# Patient Record
Sex: Female | Born: 1965 | Race: Black or African American | Hispanic: No | Marital: Married
Health system: Southern US, Community
[De-identification: ages and names within clinical notes are randomized; demographics above are authoritative.]

## PROBLEM LIST (undated history)

## (undated) HISTORY — PX: BREAST EXCISIONAL BIOPSY: SUR124

---

## 2007-01-08 ENCOUNTER — Other Ambulatory Visit: Admission: RE | Admit: 2007-01-08 | Discharge: 2007-01-08 | Payer: Self-pay | Admitting: Family Medicine

## 2007-03-20 ENCOUNTER — Encounter: Admission: RE | Admit: 2007-03-20 | Discharge: 2007-03-20 | Payer: Self-pay | Admitting: Family Medicine

## 2008-04-09 ENCOUNTER — Encounter: Admission: RE | Admit: 2008-04-09 | Discharge: 2008-04-09 | Payer: Self-pay | Admitting: Family Medicine

## 2009-02-23 ENCOUNTER — Ambulatory Visit (HOSPITAL_COMMUNITY): Admission: RE | Admit: 2009-02-23 | Discharge: 2009-02-23 | Payer: Self-pay | Admitting: Obstetrics and Gynecology

## 2009-02-23 ENCOUNTER — Encounter (INDEPENDENT_AMBULATORY_CARE_PROVIDER_SITE_OTHER): Payer: Self-pay | Admitting: Obstetrics and Gynecology

## 2011-03-15 LAB — RH IMMUNE GLOBULIN WORKUP (NOT WOMEN'S HOSP): Antibody Screen: NEGATIVE

## 2011-03-15 LAB — CBC
HCT: 42.5 % (ref 36.0–46.0)
Hemoglobin: 14.3 g/dL (ref 12.0–15.0)
RBC: 4.29 MIL/uL (ref 3.87–5.11)
WBC: 7.8 10*3/uL (ref 4.0–10.5)

## 2011-04-17 NOTE — H&P (Signed)
NAME:  ARELLA, BLINDER NO.:  [id number]   MEDICAL RECORD NO.:  [id number]          PATIENT TYPE:  AMB   LOCATION:  SDC                           FACILITY:  [facility]   PHYSICIAN:  Huel Cote, M.D. DATE OF BIRTH:  [birth year]   DATE OF ADMISSION:  DATE OF DISCHARGE:                              HISTORY & PHYSICAL   The patient is a 45 year old, G1, P0, who is coming in with an  unfortunate finding of a fetal demise at 9-1/2 weeks' gestation.  The  patient was found to have this on her routine OB workup.  This has been  confirmed by ultrasound.  She was given several options and desired to  proceed with a D and E given the high likelihood of heavy bleeding when  she did ultimately miscarry.  Other than her advanced maternal age, she  has had no other issues with the pregnancy except for an Rh negative  status and she will require RhoGAM.   PAST MEDICAL HISTORY:  Significant for thyroid disease, which has been  monitored by Dr. Talmage Nap.  On her prenatal workup, her TSH was slightly  elevated.  Her medications have been adjusted accordingly.  Her other  medical history is significant only for allergies.  She does have some  depression and is on Zoloft 100 mg p.o. daily for that.   PAST SURGICAL HISTORY:  She had a benign lumpectomy on her left breast  and a wisdom tooth extraction.  She is usually a smoker, however, did  quit with pregnancy.   PAST GYN HISTORY:  Abnormal Pap smear 2 years ago with a last colposcopy  within normal limits and the last Pap smear within normal limits.  She  does have some fibroids noted on ultrasound.   PHYSICAL EXAMINATION:  VITAL SIGNS:  The patient is 164 pounds and blood  pressure is 100/60.  CARDIAC:  Regular rate and rhythm.  LUNGS:  Clear.  ABDOMEN:  Soft and nontender.  PELVIC:  Uterus consistent with a 9-week gestation and no other external  problems noted.   Again, the patient was counseled as to the risks and benefits of  surgery  including bleeding and uterine perforation.  She understands these  risks.  However, desires to proceed with the surgery as stated.  We will  proceed with a D and E with Cytotec treatment 3-4 hours prior surgery to  minimize the risk of uterine perforation and the patient is agreeable  with this process.      Huel Cote, M.D.  Electronically Signed     KR/MEDQ  D:  02/17/2009  T:  02/18/2009  Job:  [id number]

## 2011-04-17 NOTE — Op Note (Signed)
NAME:  April Elliott, April Elliott NO.:  [id number]   MEDICAL RECORD NO.:  [id number]          PATIENT TYPE:  AMB   LOCATION:  SDC                           [facility]ILITY:  [facility]   PHYSICIAN:  Huel Cote, M.D. DATE OF BIRTH:  November 03, 1966   DATE OF PROCEDURE:  02/23/2009  DATE OF DISCHARGE:                               OPERATIVE REPORT   PREOPERATIVE DIAGNOSIS:  Missed abortion at 9+ weeks.   POSTOPERATIVE DIAGNOSIS:  Missed abortion at 9+ weeks.   PROCEDURE:  Suction, dilation and evacuation.   SURGEON:  Huel Cote, M.D.   ASSISTANT:  None.   ANESTHESIA:  MAC with 1% paracervical block.   SPECIMENS:  Moderate amount of POCs were sent to Pathology and to  Genetics.   ESTIMATED BLOOD LOSS:  50 mL.   IV FLUIDS:  1 L.   COMPLICATIONS:  None known.   DESCRIPTION OF PROCEDURE:  The patient was taken to operating room where  MAC anesthesia was obtained without difficulty.  She was then prepped  and draped in normal sterile fashion in the dorsal lithotomy position.  A speculum was placed within the vagina and the cervix was identified  and injected on the anterior lip with 2 mL of 1% lidocaine plain.  A  tenaculum was then placed and additional paracervical block was  performed at 2 and 10 o'clock with approximately 10 mL each of each  placed at each location.  The uterus was then easily sounded to  approximately 9-10 cm and a size to 8 suction curette chosen.  Minimal  amount of dilation was needed and the suction curette was easily  introduced into the fundus.  Suction was applied and moderate-to-large  amount of products of conception were obtained in several passes.  The  suction was discontinued and a sharp curettage performed with no  additional tissue palpated.  Two additional passes of the suction were  performed with really no tissue obtained.  Therefore the procedure was  concluded and the tenaculum removed.  Small areas of bleeding at that  site were  treated with silver nitrate.  There is absolutely no active  bleeding noted at the end of procedure and the patient was taken to the  recovery room in stable condition.  She will be discharged home from  there and received RhoGAM due to her Rh negative status, prior to  discharge.      Huel Cote, M.D.  Electronically Signed     KR/MEDQ  D:  02/23/2009  T:  02/24/2009  Job:  [id number]

## 2011-05-25 ENCOUNTER — Other Ambulatory Visit: Payer: Self-pay | Admitting: Family Medicine

## 2011-05-25 DIAGNOSIS — Z1231 Encounter for screening mammogram for malignant neoplasm of breast: Secondary | ICD-10-CM

## 2011-06-07 ENCOUNTER — Ambulatory Visit
Admission: RE | Admit: 2011-06-07 | Discharge: 2011-06-07 | Disposition: A | Discharge status: Home / Self Care | Payer: BC Managed Care – PPO | Source: Ambulatory Visit | Attending: Family Medicine | Admitting: Family Medicine

## 2011-06-07 DIAGNOSIS — Z1231 Encounter for screening mammogram for malignant neoplasm of breast: Secondary | ICD-10-CM

## 2011-07-18 ENCOUNTER — Other Ambulatory Visit: Payer: Self-pay | Admitting: Family Medicine

## 2011-07-18 DIAGNOSIS — N644 Mastodynia: Secondary | ICD-10-CM

## 2011-08-01 ENCOUNTER — Ambulatory Visit
Admission: RE | Admit: 2011-08-01 | Discharge: 2011-08-01 | Disposition: A | Discharge status: Home / Self Care | Payer: BC Managed Care – PPO | Source: Ambulatory Visit | Attending: Family Medicine | Admitting: Family Medicine

## 2011-08-01 ENCOUNTER — Other Ambulatory Visit: Payer: Self-pay | Admitting: Family Medicine

## 2011-08-01 DIAGNOSIS — N644 Mastodynia: Secondary | ICD-10-CM

## 2011-11-15 ENCOUNTER — Ambulatory Visit: Payer: BC Managed Care – PPO | Admitting: Physical Therapy

## 2016-03-05 DIAGNOSIS — N390 Urinary tract infection, site not specified: Secondary | ICD-10-CM | POA: Diagnosis not present

## 2016-03-05 DIAGNOSIS — M545 Low back pain: Secondary | ICD-10-CM | POA: Diagnosis not present

## 2016-03-06 DIAGNOSIS — R399 Unspecified symptoms and signs involving the genitourinary system: Secondary | ICD-10-CM | POA: Diagnosis not present

## 2016-03-06 DIAGNOSIS — R3915 Urgency of urination: Secondary | ICD-10-CM | POA: Diagnosis not present

## 2016-03-06 DIAGNOSIS — R109 Unspecified abdominal pain: Secondary | ICD-10-CM | POA: Diagnosis not present

## 2016-03-07 DIAGNOSIS — Q446 Cystic disease of liver: Secondary | ICD-10-CM | POA: Diagnosis not present

## 2016-03-07 DIAGNOSIS — K449 Diaphragmatic hernia without obstruction or gangrene: Secondary | ICD-10-CM | POA: Diagnosis not present

## 2016-03-08 DIAGNOSIS — M5412 Radiculopathy, cervical region: Secondary | ICD-10-CM | POA: Diagnosis not present

## 2016-03-15 DIAGNOSIS — R3915 Urgency of urination: Secondary | ICD-10-CM | POA: Diagnosis not present

## 2016-03-15 DIAGNOSIS — D3501 Benign neoplasm of right adrenal gland: Secondary | ICD-10-CM | POA: Diagnosis not present

## 2016-03-15 DIAGNOSIS — R109 Unspecified abdominal pain: Secondary | ICD-10-CM | POA: Diagnosis not present

## 2016-03-15 DIAGNOSIS — R399 Unspecified symptoms and signs involving the genitourinary system: Secondary | ICD-10-CM | POA: Diagnosis not present

## 2016-03-20 DIAGNOSIS — M5416 Radiculopathy, lumbar region: Secondary | ICD-10-CM | POA: Diagnosis not present

## 2016-03-20 DIAGNOSIS — M5412 Radiculopathy, cervical region: Secondary | ICD-10-CM | POA: Diagnosis not present

## 2016-03-22 DIAGNOSIS — L80 Vitiligo: Secondary | ICD-10-CM | POA: Diagnosis not present

## 2016-03-23 DIAGNOSIS — M5186 Other intervertebral disc disorders, lumbar region: Secondary | ICD-10-CM | POA: Diagnosis not present

## 2016-03-23 DIAGNOSIS — M4806 Spinal stenosis, lumbar region: Secondary | ICD-10-CM | POA: Diagnosis not present

## 2016-03-27 DIAGNOSIS — L80 Vitiligo: Secondary | ICD-10-CM | POA: Diagnosis not present

## 2016-03-29 DIAGNOSIS — L680 Hirsutism: Secondary | ICD-10-CM | POA: Diagnosis not present

## 2016-03-29 DIAGNOSIS — M545 Low back pain: Secondary | ICD-10-CM | POA: Diagnosis not present

## 2016-03-29 DIAGNOSIS — I781 Nevus, non-neoplastic: Secondary | ICD-10-CM | POA: Diagnosis not present

## 2016-03-29 DIAGNOSIS — L80 Vitiligo: Secondary | ICD-10-CM | POA: Diagnosis not present

## 2016-03-29 DIAGNOSIS — M5412 Radiculopathy, cervical region: Secondary | ICD-10-CM | POA: Diagnosis not present

## 2016-03-29 DIAGNOSIS — M5416 Radiculopathy, lumbar region: Secondary | ICD-10-CM | POA: Diagnosis not present

## 2016-04-02 DIAGNOSIS — R399 Unspecified symptoms and signs involving the genitourinary system: Secondary | ICD-10-CM | POA: Diagnosis not present

## 2016-04-02 DIAGNOSIS — L80 Vitiligo: Secondary | ICD-10-CM | POA: Diagnosis not present

## 2016-04-02 DIAGNOSIS — R3915 Urgency of urination: Secondary | ICD-10-CM | POA: Diagnosis not present

## 2016-04-02 DIAGNOSIS — R109 Unspecified abdominal pain: Secondary | ICD-10-CM | POA: Diagnosis not present

## 2016-04-03 DIAGNOSIS — M545 Low back pain: Secondary | ICD-10-CM | POA: Diagnosis not present

## 2016-04-03 DIAGNOSIS — B373 Candidiasis of vulva and vagina: Secondary | ICD-10-CM | POA: Diagnosis not present

## 2016-04-03 DIAGNOSIS — M542 Cervicalgia: Secondary | ICD-10-CM | POA: Diagnosis not present

## 2016-04-04 DIAGNOSIS — L80 Vitiligo: Secondary | ICD-10-CM | POA: Diagnosis not present

## 2016-04-06 DIAGNOSIS — L80 Vitiligo: Secondary | ICD-10-CM | POA: Diagnosis not present

## 2016-04-16 DIAGNOSIS — Z113 Encounter for screening for infections with a predominantly sexual mode of transmission: Secondary | ICD-10-CM | POA: Diagnosis not present

## 2016-04-16 DIAGNOSIS — Z1151 Encounter for screening for human papillomavirus (HPV): Secondary | ICD-10-CM | POA: Diagnosis not present

## 2016-04-16 DIAGNOSIS — N898 Other specified noninflammatory disorders of vagina: Secondary | ICD-10-CM | POA: Diagnosis not present

## 2016-04-16 DIAGNOSIS — Z6832 Body mass index (BMI) 32.0-32.9, adult: Secondary | ICD-10-CM | POA: Diagnosis not present

## 2016-04-16 DIAGNOSIS — Z01419 Encounter for gynecological examination (general) (routine) without abnormal findings: Secondary | ICD-10-CM | POA: Diagnosis not present

## 2016-04-16 DIAGNOSIS — R8290 Unspecified abnormal findings in urine: Secondary | ICD-10-CM | POA: Diagnosis not present

## 2016-04-16 DIAGNOSIS — E039 Hypothyroidism, unspecified: Secondary | ICD-10-CM | POA: Diagnosis not present

## 2016-04-16 DIAGNOSIS — Z79899 Other long term (current) drug therapy: Secondary | ICD-10-CM | POA: Diagnosis not present

## 2016-04-16 DIAGNOSIS — Z130 Encounter for screening for diseases of the blood and blood-forming organs and certain disorders involving the immune mechanism: Secondary | ICD-10-CM | POA: Diagnosis not present

## 2016-04-16 DIAGNOSIS — F3342 Major depressive disorder, recurrent, in full remission: Secondary | ICD-10-CM | POA: Diagnosis not present

## 2016-04-16 DIAGNOSIS — Z1231 Encounter for screening mammogram for malignant neoplasm of breast: Secondary | ICD-10-CM | POA: Diagnosis not present

## 2016-04-16 DIAGNOSIS — Z124 Encounter for screening for malignant neoplasm of cervix: Secondary | ICD-10-CM | POA: Diagnosis not present

## 2016-04-16 DIAGNOSIS — F419 Anxiety disorder, unspecified: Secondary | ICD-10-CM | POA: Diagnosis not present

## 2016-04-16 DIAGNOSIS — M542 Cervicalgia: Secondary | ICD-10-CM | POA: Diagnosis not present

## 2016-04-16 DIAGNOSIS — D259 Leiomyoma of uterus, unspecified: Secondary | ICD-10-CM | POA: Diagnosis not present

## 2016-04-16 DIAGNOSIS — Z1389 Encounter for screening for other disorder: Secondary | ICD-10-CM | POA: Diagnosis not present

## 2016-04-24 DIAGNOSIS — M542 Cervicalgia: Secondary | ICD-10-CM | POA: Diagnosis not present

## 2016-04-24 DIAGNOSIS — M545 Low back pain: Secondary | ICD-10-CM | POA: Diagnosis not present

## 2016-04-27 DIAGNOSIS — M542 Cervicalgia: Secondary | ICD-10-CM | POA: Diagnosis not present

## 2016-04-27 DIAGNOSIS — M545 Low back pain: Secondary | ICD-10-CM | POA: Diagnosis not present

## 2016-05-01 DIAGNOSIS — M545 Low back pain: Secondary | ICD-10-CM | POA: Diagnosis not present

## 2016-05-01 DIAGNOSIS — M542 Cervicalgia: Secondary | ICD-10-CM | POA: Diagnosis not present

## 2016-05-02 DIAGNOSIS — R32 Unspecified urinary incontinence: Secondary | ICD-10-CM | POA: Diagnosis not present

## 2016-05-02 DIAGNOSIS — D259 Leiomyoma of uterus, unspecified: Secondary | ICD-10-CM | POA: Diagnosis not present

## 2016-05-02 DIAGNOSIS — G8929 Other chronic pain: Secondary | ICD-10-CM | POA: Diagnosis not present

## 2016-05-02 DIAGNOSIS — N926 Irregular menstruation, unspecified: Secondary | ICD-10-CM | POA: Diagnosis not present

## 2016-05-02 DIAGNOSIS — D252 Subserosal leiomyoma of uterus: Secondary | ICD-10-CM | POA: Diagnosis not present

## 2016-05-02 DIAGNOSIS — D251 Intramural leiomyoma of uterus: Secondary | ICD-10-CM | POA: Diagnosis not present

## 2016-05-03 DIAGNOSIS — M542 Cervicalgia: Secondary | ICD-10-CM | POA: Diagnosis not present

## 2016-05-03 DIAGNOSIS — M545 Low back pain: Secondary | ICD-10-CM | POA: Diagnosis not present

## 2016-05-08 DIAGNOSIS — M542 Cervicalgia: Secondary | ICD-10-CM | POA: Diagnosis not present

## 2016-05-08 DIAGNOSIS — M545 Low back pain: Secondary | ICD-10-CM | POA: Diagnosis not present

## 2016-05-10 DIAGNOSIS — M542 Cervicalgia: Secondary | ICD-10-CM | POA: Diagnosis not present

## 2016-05-10 DIAGNOSIS — M545 Low back pain: Secondary | ICD-10-CM | POA: Diagnosis not present

## 2016-05-18 DIAGNOSIS — M5030 Other cervical disc degeneration, unspecified cervical region: Secondary | ICD-10-CM | POA: Diagnosis not present

## 2016-05-18 DIAGNOSIS — M542 Cervicalgia: Secondary | ICD-10-CM | POA: Diagnosis not present

## 2016-05-18 DIAGNOSIS — S335XXD Sprain of ligaments of lumbar spine, subsequent encounter: Secondary | ICD-10-CM | POA: Diagnosis not present

## 2016-05-18 DIAGNOSIS — M545 Low back pain: Secondary | ICD-10-CM | POA: Diagnosis not present

## 2016-05-21 DIAGNOSIS — M545 Low back pain: Secondary | ICD-10-CM | POA: Diagnosis not present

## 2016-05-21 DIAGNOSIS — M5030 Other cervical disc degeneration, unspecified cervical region: Secondary | ICD-10-CM | POA: Diagnosis not present

## 2016-05-21 DIAGNOSIS — S335XXD Sprain of ligaments of lumbar spine, subsequent encounter: Secondary | ICD-10-CM | POA: Diagnosis not present

## 2016-05-24 DIAGNOSIS — S335XXD Sprain of ligaments of lumbar spine, subsequent encounter: Secondary | ICD-10-CM | POA: Diagnosis not present

## 2016-05-24 DIAGNOSIS — H6123 Impacted cerumen, bilateral: Secondary | ICD-10-CM | POA: Diagnosis not present

## 2016-05-24 DIAGNOSIS — M542 Cervicalgia: Secondary | ICD-10-CM | POA: Diagnosis not present

## 2016-05-24 DIAGNOSIS — M545 Low back pain: Secondary | ICD-10-CM | POA: Diagnosis not present

## 2016-05-24 DIAGNOSIS — M5030 Other cervical disc degeneration, unspecified cervical region: Secondary | ICD-10-CM | POA: Diagnosis not present

## 2016-05-24 DIAGNOSIS — H9193 Unspecified hearing loss, bilateral: Secondary | ICD-10-CM | POA: Diagnosis not present

## 2016-05-29 DIAGNOSIS — S335XXD Sprain of ligaments of lumbar spine, subsequent encounter: Secondary | ICD-10-CM | POA: Diagnosis not present

## 2016-05-29 DIAGNOSIS — M545 Low back pain: Secondary | ICD-10-CM | POA: Diagnosis not present

## 2016-05-29 DIAGNOSIS — M5030 Other cervical disc degeneration, unspecified cervical region: Secondary | ICD-10-CM | POA: Diagnosis not present

## 2016-06-01 DIAGNOSIS — M542 Cervicalgia: Secondary | ICD-10-CM | POA: Diagnosis not present

## 2016-06-01 DIAGNOSIS — M545 Low back pain: Secondary | ICD-10-CM | POA: Diagnosis not present

## 2016-06-01 DIAGNOSIS — M5030 Other cervical disc degeneration, unspecified cervical region: Secondary | ICD-10-CM | POA: Diagnosis not present

## 2016-06-01 DIAGNOSIS — S335XXD Sprain of ligaments of lumbar spine, subsequent encounter: Secondary | ICD-10-CM | POA: Diagnosis not present

## 2016-06-04 DIAGNOSIS — M5030 Other cervical disc degeneration, unspecified cervical region: Secondary | ICD-10-CM | POA: Diagnosis not present

## 2016-06-04 DIAGNOSIS — S335XXD Sprain of ligaments of lumbar spine, subsequent encounter: Secondary | ICD-10-CM | POA: Diagnosis not present

## 2016-06-04 DIAGNOSIS — M545 Low back pain: Secondary | ICD-10-CM | POA: Diagnosis not present

## 2016-06-13 DIAGNOSIS — N760 Acute vaginitis: Secondary | ICD-10-CM | POA: Diagnosis not present

## 2016-06-19 DIAGNOSIS — M542 Cervicalgia: Secondary | ICD-10-CM | POA: Diagnosis not present

## 2016-06-19 DIAGNOSIS — M545 Low back pain: Secondary | ICD-10-CM | POA: Diagnosis not present

## 2016-06-19 DIAGNOSIS — M5030 Other cervical disc degeneration, unspecified cervical region: Secondary | ICD-10-CM | POA: Diagnosis not present

## 2016-06-19 DIAGNOSIS — S335XXD Sprain of ligaments of lumbar spine, subsequent encounter: Secondary | ICD-10-CM | POA: Diagnosis not present

## 2016-06-27 DIAGNOSIS — M5030 Other cervical disc degeneration, unspecified cervical region: Secondary | ICD-10-CM | POA: Diagnosis not present

## 2016-06-27 DIAGNOSIS — M542 Cervicalgia: Secondary | ICD-10-CM | POA: Diagnosis not present

## 2016-06-27 DIAGNOSIS — S335XXD Sprain of ligaments of lumbar spine, subsequent encounter: Secondary | ICD-10-CM | POA: Diagnosis not present

## 2016-06-27 DIAGNOSIS — M545 Low back pain: Secondary | ICD-10-CM | POA: Diagnosis not present

## 2016-07-03 DIAGNOSIS — S335XXD Sprain of ligaments of lumbar spine, subsequent encounter: Secondary | ICD-10-CM | POA: Diagnosis not present

## 2016-07-03 DIAGNOSIS — M542 Cervicalgia: Secondary | ICD-10-CM | POA: Diagnosis not present

## 2016-07-03 DIAGNOSIS — M5030 Other cervical disc degeneration, unspecified cervical region: Secondary | ICD-10-CM | POA: Diagnosis not present

## 2016-07-03 DIAGNOSIS — M545 Low back pain: Secondary | ICD-10-CM | POA: Diagnosis not present

## 2016-07-10 DIAGNOSIS — Z0000 Encounter for general adult medical examination without abnormal findings: Secondary | ICD-10-CM | POA: Diagnosis not present

## 2016-07-12 DIAGNOSIS — M545 Low back pain: Secondary | ICD-10-CM | POA: Diagnosis not present

## 2016-07-12 DIAGNOSIS — M5030 Other cervical disc degeneration, unspecified cervical region: Secondary | ICD-10-CM | POA: Diagnosis not present

## 2016-07-12 DIAGNOSIS — M542 Cervicalgia: Secondary | ICD-10-CM | POA: Diagnosis not present

## 2016-07-12 DIAGNOSIS — S335XXD Sprain of ligaments of lumbar spine, subsequent encounter: Secondary | ICD-10-CM | POA: Diagnosis not present

## 2016-07-16 DIAGNOSIS — Z0000 Encounter for general adult medical examination without abnormal findings: Secondary | ICD-10-CM | POA: Diagnosis not present

## 2016-07-16 DIAGNOSIS — E039 Hypothyroidism, unspecified: Secondary | ICD-10-CM | POA: Diagnosis not present

## 2016-07-16 DIAGNOSIS — H6123 Impacted cerumen, bilateral: Secondary | ICD-10-CM | POA: Diagnosis not present

## 2016-07-16 DIAGNOSIS — F3342 Major depressive disorder, recurrent, in full remission: Secondary | ICD-10-CM | POA: Diagnosis not present

## 2016-07-16 DIAGNOSIS — E6609 Other obesity due to excess calories: Secondary | ICD-10-CM | POA: Diagnosis not present

## 2016-07-16 DIAGNOSIS — Z23 Encounter for immunization: Secondary | ICD-10-CM | POA: Diagnosis not present

## 2016-07-16 DIAGNOSIS — B373 Candidiasis of vulva and vagina: Secondary | ICD-10-CM | POA: Diagnosis not present

## 2016-07-16 DIAGNOSIS — F411 Generalized anxiety disorder: Secondary | ICD-10-CM | POA: Diagnosis not present

## 2016-07-25 DIAGNOSIS — Z1211 Encounter for screening for malignant neoplasm of colon: Secondary | ICD-10-CM | POA: Diagnosis not present

## 2016-07-25 DIAGNOSIS — Z01818 Encounter for other preprocedural examination: Secondary | ICD-10-CM | POA: Diagnosis not present

## 2016-07-25 DIAGNOSIS — K219 Gastro-esophageal reflux disease without esophagitis: Secondary | ICD-10-CM | POA: Diagnosis not present

## 2016-07-25 DIAGNOSIS — K5900 Constipation, unspecified: Secondary | ICD-10-CM | POA: Diagnosis not present

## 2016-07-26 DIAGNOSIS — M542 Cervicalgia: Secondary | ICD-10-CM | POA: Diagnosis not present

## 2016-07-26 DIAGNOSIS — M5020 Other cervical disc displacement, unspecified cervical region: Secondary | ICD-10-CM | POA: Diagnosis not present

## 2016-07-26 DIAGNOSIS — M545 Low back pain: Secondary | ICD-10-CM | POA: Diagnosis not present

## 2016-07-26 DIAGNOSIS — S335XXD Sprain of ligaments of lumbar spine, subsequent encounter: Secondary | ICD-10-CM | POA: Diagnosis not present

## 2016-07-31 DIAGNOSIS — M5030 Other cervical disc degeneration, unspecified cervical region: Secondary | ICD-10-CM | POA: Diagnosis not present

## 2016-07-31 DIAGNOSIS — M545 Low back pain: Secondary | ICD-10-CM | POA: Diagnosis not present

## 2016-07-31 DIAGNOSIS — M542 Cervicalgia: Secondary | ICD-10-CM | POA: Diagnosis not present

## 2016-07-31 DIAGNOSIS — S335XXD Sprain of ligaments of lumbar spine, subsequent encounter: Secondary | ICD-10-CM | POA: Diagnosis not present

## 2016-08-08 DIAGNOSIS — M542 Cervicalgia: Secondary | ICD-10-CM | POA: Diagnosis not present

## 2016-08-08 DIAGNOSIS — M5030 Other cervical disc degeneration, unspecified cervical region: Secondary | ICD-10-CM | POA: Diagnosis not present

## 2016-08-08 DIAGNOSIS — M545 Low back pain: Secondary | ICD-10-CM | POA: Diagnosis not present

## 2016-08-08 DIAGNOSIS — S335XXD Sprain of ligaments of lumbar spine, subsequent encounter: Secondary | ICD-10-CM | POA: Diagnosis not present

## 2016-08-15 DIAGNOSIS — M5030 Other cervical disc degeneration, unspecified cervical region: Secondary | ICD-10-CM | POA: Diagnosis not present

## 2016-08-15 DIAGNOSIS — M542 Cervicalgia: Secondary | ICD-10-CM | POA: Diagnosis not present

## 2016-08-15 DIAGNOSIS — M545 Low back pain: Secondary | ICD-10-CM | POA: Diagnosis not present

## 2016-08-15 DIAGNOSIS — S335XXD Sprain of ligaments of lumbar spine, subsequent encounter: Secondary | ICD-10-CM | POA: Diagnosis not present

## 2016-08-29 DIAGNOSIS — S335XXD Sprain of ligaments of lumbar spine, subsequent encounter: Secondary | ICD-10-CM | POA: Diagnosis not present

## 2016-08-29 DIAGNOSIS — M545 Low back pain: Secondary | ICD-10-CM | POA: Diagnosis not present

## 2016-08-29 DIAGNOSIS — M5030 Other cervical disc degeneration, unspecified cervical region: Secondary | ICD-10-CM | POA: Diagnosis not present

## 2016-08-29 DIAGNOSIS — M542 Cervicalgia: Secondary | ICD-10-CM | POA: Diagnosis not present

## 2016-09-03 DIAGNOSIS — D120 Benign neoplasm of cecum: Secondary | ICD-10-CM | POA: Diagnosis not present

## 2016-09-03 DIAGNOSIS — Z1211 Encounter for screening for malignant neoplasm of colon: Secondary | ICD-10-CM | POA: Diagnosis not present

## 2016-09-03 DIAGNOSIS — D126 Benign neoplasm of colon, unspecified: Secondary | ICD-10-CM | POA: Diagnosis not present

## 2016-09-03 DIAGNOSIS — K635 Polyp of colon: Secondary | ICD-10-CM | POA: Diagnosis not present

## 2016-09-03 DIAGNOSIS — D125 Benign neoplasm of sigmoid colon: Secondary | ICD-10-CM | POA: Diagnosis not present

## 2016-09-05 DIAGNOSIS — J329 Chronic sinusitis, unspecified: Secondary | ICD-10-CM | POA: Diagnosis not present

## 2016-09-05 DIAGNOSIS — K130 Diseases of lips: Secondary | ICD-10-CM | POA: Diagnosis not present

## 2016-09-06 DIAGNOSIS — Z1211 Encounter for screening for malignant neoplasm of colon: Secondary | ICD-10-CM | POA: Diagnosis not present

## 2016-09-06 DIAGNOSIS — K635 Polyp of colon: Secondary | ICD-10-CM | POA: Diagnosis not present

## 2016-09-06 DIAGNOSIS — D126 Benign neoplasm of colon, unspecified: Secondary | ICD-10-CM | POA: Diagnosis not present

## 2016-09-10 DIAGNOSIS — Z23 Encounter for immunization: Secondary | ICD-10-CM | POA: Diagnosis not present

## 2016-09-10 DIAGNOSIS — E6609 Other obesity due to excess calories: Secondary | ICD-10-CM | POA: Diagnosis not present

## 2016-09-10 DIAGNOSIS — F419 Anxiety disorder, unspecified: Secondary | ICD-10-CM | POA: Diagnosis not present

## 2016-09-10 DIAGNOSIS — E039 Hypothyroidism, unspecified: Secondary | ICD-10-CM | POA: Diagnosis not present

## 2016-09-10 DIAGNOSIS — Z720 Tobacco use: Secondary | ICD-10-CM | POA: Diagnosis not present

## 2016-09-12 DIAGNOSIS — M545 Low back pain: Secondary | ICD-10-CM | POA: Diagnosis not present

## 2016-09-12 DIAGNOSIS — M542 Cervicalgia: Secondary | ICD-10-CM | POA: Diagnosis not present

## 2016-09-12 DIAGNOSIS — M5030 Other cervical disc degeneration, unspecified cervical region: Secondary | ICD-10-CM | POA: Diagnosis not present

## 2016-09-12 DIAGNOSIS — S335XXD Sprain of ligaments of lumbar spine, subsequent encounter: Secondary | ICD-10-CM | POA: Diagnosis not present

## 2016-10-08 DIAGNOSIS — L650 Telogen effluvium: Secondary | ICD-10-CM | POA: Insufficient documentation

## 2016-10-08 DIAGNOSIS — L80 Vitiligo: Secondary | ICD-10-CM | POA: Insufficient documentation

## 2016-10-08 DIAGNOSIS — L089 Local infection of the skin and subcutaneous tissue, unspecified: Secondary | ICD-10-CM | POA: Diagnosis not present

## 2016-11-13 DIAGNOSIS — J209 Acute bronchitis, unspecified: Secondary | ICD-10-CM | POA: Diagnosis not present

## 2016-11-25 DIAGNOSIS — J029 Acute pharyngitis, unspecified: Secondary | ICD-10-CM | POA: Diagnosis not present

## 2016-12-07 DIAGNOSIS — M542 Cervicalgia: Secondary | ICD-10-CM | POA: Diagnosis not present

## 2016-12-13 ENCOUNTER — Other Ambulatory Visit: Payer: Self-pay | Admitting: Orthopedic Surgery

## 2016-12-13 DIAGNOSIS — M542 Cervicalgia: Secondary | ICD-10-CM

## 2016-12-14 ENCOUNTER — Ambulatory Visit
Admission: RE | Admit: 2016-12-14 | Discharge: 2016-12-14 | Disposition: A | Discharge status: Home / Self Care | Payer: BC Managed Care – PPO | Source: Ambulatory Visit | Attending: Orthopedic Surgery | Admitting: Orthopedic Surgery

## 2016-12-14 DIAGNOSIS — M542 Cervicalgia: Secondary | ICD-10-CM

## 2016-12-14 DIAGNOSIS — M50222 Other cervical disc displacement at C5-C6 level: Secondary | ICD-10-CM | POA: Diagnosis not present

## 2016-12-18 DIAGNOSIS — M542 Cervicalgia: Secondary | ICD-10-CM | POA: Diagnosis not present

## 2017-01-14 DIAGNOSIS — Z8489 Family history of other specified conditions: Secondary | ICD-10-CM | POA: Diagnosis not present

## 2017-01-14 DIAGNOSIS — D485 Neoplasm of uncertain behavior of skin: Secondary | ICD-10-CM | POA: Diagnosis not present

## 2017-01-14 DIAGNOSIS — L80 Vitiligo: Secondary | ICD-10-CM | POA: Diagnosis not present

## 2017-01-14 DIAGNOSIS — E039 Hypothyroidism, unspecified: Secondary | ICD-10-CM | POA: Diagnosis not present

## 2017-01-14 DIAGNOSIS — Z79899 Other long term (current) drug therapy: Secondary | ICD-10-CM | POA: Diagnosis not present

## 2017-01-14 DIAGNOSIS — L649 Androgenic alopecia, unspecified: Secondary | ICD-10-CM | POA: Diagnosis not present

## 2017-01-14 DIAGNOSIS — L658 Other specified nonscarring hair loss: Secondary | ICD-10-CM | POA: Diagnosis not present

## 2017-01-28 DIAGNOSIS — L218 Other seborrheic dermatitis: Secondary | ICD-10-CM | POA: Diagnosis not present

## 2017-01-28 DIAGNOSIS — Z79899 Other long term (current) drug therapy: Secondary | ICD-10-CM | POA: Diagnosis not present

## 2017-01-28 DIAGNOSIS — L80 Vitiligo: Secondary | ICD-10-CM | POA: Diagnosis not present

## 2017-01-28 DIAGNOSIS — E039 Hypothyroidism, unspecified: Secondary | ICD-10-CM | POA: Diagnosis not present

## 2017-01-28 DIAGNOSIS — L669 Cicatricial alopecia, unspecified: Secondary | ICD-10-CM | POA: Diagnosis not present

## 2017-01-28 DIAGNOSIS — L658 Other specified nonscarring hair loss: Secondary | ICD-10-CM | POA: Diagnosis not present

## 2017-01-28 DIAGNOSIS — L089 Local infection of the skin and subcutaneous tissue, unspecified: Secondary | ICD-10-CM | POA: Diagnosis not present

## 2017-01-28 DIAGNOSIS — Z8489 Family history of other specified conditions: Secondary | ICD-10-CM | POA: Diagnosis not present

## 2017-03-22 DIAGNOSIS — L603 Nail dystrophy: Secondary | ICD-10-CM | POA: Diagnosis not present

## 2017-03-22 DIAGNOSIS — K219 Gastro-esophageal reflux disease without esophagitis: Secondary | ICD-10-CM | POA: Diagnosis not present

## 2017-03-22 DIAGNOSIS — Z720 Tobacco use: Secondary | ICD-10-CM | POA: Diagnosis not present

## 2017-03-22 DIAGNOSIS — K5900 Constipation, unspecified: Secondary | ICD-10-CM | POA: Diagnosis not present

## 2017-03-25 DIAGNOSIS — L219 Seborrheic dermatitis, unspecified: Secondary | ICD-10-CM | POA: Diagnosis not present

## 2017-03-25 DIAGNOSIS — L80 Vitiligo: Secondary | ICD-10-CM | POA: Diagnosis not present

## 2017-03-25 DIAGNOSIS — L669 Cicatricial alopecia, unspecified: Secondary | ICD-10-CM | POA: Diagnosis not present

## 2017-03-25 DIAGNOSIS — L658 Other specified nonscarring hair loss: Secondary | ICD-10-CM | POA: Diagnosis not present

## 2017-03-25 DIAGNOSIS — L089 Local infection of the skin and subcutaneous tissue, unspecified: Secondary | ICD-10-CM | POA: Diagnosis not present

## 2017-03-25 DIAGNOSIS — L218 Other seborrheic dermatitis: Secondary | ICD-10-CM | POA: Diagnosis not present

## 2017-04-01 DIAGNOSIS — E039 Hypothyroidism, unspecified: Secondary | ICD-10-CM | POA: Diagnosis not present

## 2017-04-01 DIAGNOSIS — Z13220 Encounter for screening for lipoid disorders: Secondary | ICD-10-CM | POA: Diagnosis not present

## 2017-04-01 DIAGNOSIS — Z5181 Encounter for therapeutic drug level monitoring: Secondary | ICD-10-CM | POA: Diagnosis not present

## 2017-04-15 DIAGNOSIS — N950 Postmenopausal bleeding: Secondary | ICD-10-CM | POA: Diagnosis not present

## 2017-04-15 DIAGNOSIS — D251 Intramural leiomyoma of uterus: Secondary | ICD-10-CM | POA: Diagnosis not present

## 2017-05-09 DIAGNOSIS — K5900 Constipation, unspecified: Secondary | ICD-10-CM | POA: Diagnosis not present

## 2017-05-09 DIAGNOSIS — K219 Gastro-esophageal reflux disease without esophagitis: Secondary | ICD-10-CM | POA: Diagnosis not present

## 2017-05-09 DIAGNOSIS — E039 Hypothyroidism, unspecified: Secondary | ICD-10-CM | POA: Diagnosis not present

## 2017-05-09 DIAGNOSIS — L603 Nail dystrophy: Secondary | ICD-10-CM | POA: Diagnosis not present

## 2017-05-14 DIAGNOSIS — Z124 Encounter for screening for malignant neoplasm of cervix: Secondary | ICD-10-CM | POA: Diagnosis not present

## 2017-05-14 DIAGNOSIS — Z6832 Body mass index (BMI) 32.0-32.9, adult: Secondary | ICD-10-CM | POA: Diagnosis not present

## 2017-05-14 DIAGNOSIS — Z1231 Encounter for screening mammogram for malignant neoplasm of breast: Secondary | ICD-10-CM | POA: Diagnosis not present

## 2017-05-14 DIAGNOSIS — Z130 Encounter for screening for diseases of the blood and blood-forming organs and certain disorders involving the immune mechanism: Secondary | ICD-10-CM | POA: Diagnosis not present

## 2017-05-14 DIAGNOSIS — N926 Irregular menstruation, unspecified: Secondary | ICD-10-CM | POA: Diagnosis not present

## 2017-05-14 DIAGNOSIS — Z1389 Encounter for screening for other disorder: Secondary | ICD-10-CM | POA: Diagnosis not present

## 2017-05-14 DIAGNOSIS — Z1151 Encounter for screening for human papillomavirus (HPV): Secondary | ICD-10-CM | POA: Diagnosis not present

## 2017-05-14 DIAGNOSIS — R102 Pelvic and perineal pain: Secondary | ICD-10-CM | POA: Diagnosis not present

## 2017-05-14 DIAGNOSIS — Z01419 Encounter for gynecological examination (general) (routine) without abnormal findings: Secondary | ICD-10-CM | POA: Diagnosis not present

## 2017-05-15 DIAGNOSIS — R1031 Right lower quadrant pain: Secondary | ICD-10-CM | POA: Diagnosis not present

## 2017-05-31 ENCOUNTER — Ambulatory Visit: Payer: BLUE CROSS/BLUE SHIELD | Admitting: Podiatry

## 2017-06-24 ENCOUNTER — Ambulatory Visit: Payer: BLUE CROSS/BLUE SHIELD | Admitting: Podiatry

## 2017-06-26 ENCOUNTER — Ambulatory Visit (INDEPENDENT_AMBULATORY_CARE_PROVIDER_SITE_OTHER): Payer: BLUE CROSS/BLUE SHIELD

## 2017-06-26 ENCOUNTER — Ambulatory Visit (INDEPENDENT_AMBULATORY_CARE_PROVIDER_SITE_OTHER): Payer: BLUE CROSS/BLUE SHIELD | Admitting: Podiatry

## 2017-06-26 DIAGNOSIS — M21619 Bunion of unspecified foot: Secondary | ICD-10-CM

## 2017-06-26 DIAGNOSIS — B351 Tinea unguium: Secondary | ICD-10-CM | POA: Diagnosis not present

## 2017-06-26 MED ORDER — NONFORMULARY OR COMPOUNDED ITEM: 0 refills | Status: DC

## 2017-06-27 DIAGNOSIS — R0683 Snoring: Secondary | ICD-10-CM | POA: Diagnosis not present

## 2017-06-27 DIAGNOSIS — R0681 Apnea, not elsewhere classified: Secondary | ICD-10-CM | POA: Diagnosis not present

## 2017-06-27 DIAGNOSIS — G4719 Other hypersomnia: Secondary | ICD-10-CM | POA: Diagnosis not present

## 2017-06-29 NOTE — Progress Notes (Signed)
   Subjective: 51 year old female presents to the office today for evaluation of dorsal foot pain and a bunion to the right foot. Patient states the bunion is been present for several years. Patient also has a nail problem. She believes that the nails of the right foot are thickened and curved and. Patient presents today for further treatment and evaluation  Objective: Physical Exam General: The patient is alert and oriented x3 in no acute distress.  Dermatology: Hyperkeratotic thickened discolored toenail noted to the second digit right foot. All other nails appear to be normal in appearance. Skin is cool, dry and supple bilateral lower extremities. Negative for open lesions or macerations.  Vascular: Palpable pedal pulses bilaterally. No edema or erythema noted. Capillary refill within normal limits.  Neurological: Epicritic and protective threshold grossly intact bilaterally.   Musculoskeletal Exam: Clinical evidence of bunion deformity noted to the respective foot. There is a moderate pain on palpation range of motion of the first MPJ. Lateral deviation of the hallux noted consistent with hallux abductovalgus.  Radiographic Exam: Increased intermetatarsal angle greater than 15 with a hallux abductus angle greater than 30 noted on AP view. Moderate degenerative changes noted within the first MPJ.  Assessment: 1. 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 Patient was evaluated. 2. Today we discussed the conservative versus surgical management of bunion deformity. 3.  Today patient opts for conservative management at the moment including wider shoe gear and good supportive insoles. 4. Prescription for antifungal [facility] lac[facility] to be applied topically daily 5. Return to clinic when necessary   Felecia ShellingBrent M. Evans, DPM [facility]  Dr. Felecia ShellingBrent M. Evans, DPM    [address]                                         [place], [place] [address]                Office [phone]  Fax [phone]

## 2017-07-19 DIAGNOSIS — G4733 Obstructive sleep apnea (adult) (pediatric): Secondary | ICD-10-CM | POA: Diagnosis not present

## 2017-07-22 DIAGNOSIS — G4733 Obstructive sleep apnea (adult) (pediatric): Secondary | ICD-10-CM | POA: Diagnosis not present

## 2017-08-01 DIAGNOSIS — M542 Cervicalgia: Secondary | ICD-10-CM | POA: Diagnosis not present

## 2017-08-01 DIAGNOSIS — M47812 Spondylosis without myelopathy or radiculopathy, cervical region: Secondary | ICD-10-CM | POA: Diagnosis not present

## 2017-08-01 DIAGNOSIS — G4733 Obstructive sleep apnea (adult) (pediatric): Secondary | ICD-10-CM | POA: Diagnosis not present

## 2017-08-06 DIAGNOSIS — G4733 Obstructive sleep apnea (adult) (pediatric): Secondary | ICD-10-CM | POA: Diagnosis not present

## 2017-08-10 DIAGNOSIS — L298 Other pruritus: Secondary | ICD-10-CM | POA: Diagnosis not present

## 2017-08-10 DIAGNOSIS — B373 Candidiasis of vulva and vagina: Secondary | ICD-10-CM | POA: Diagnosis not present

## 2017-08-19 DIAGNOSIS — L659 Nonscarring hair loss, unspecified: Secondary | ICD-10-CM | POA: Diagnosis not present

## 2017-08-19 DIAGNOSIS — L089 Local infection of the skin and subcutaneous tissue, unspecified: Secondary | ICD-10-CM | POA: Diagnosis not present

## 2017-08-19 DIAGNOSIS — L219 Seborrheic dermatitis, unspecified: Secondary | ICD-10-CM | POA: Diagnosis not present

## 2017-08-19 DIAGNOSIS — L658 Other specified nonscarring hair loss: Secondary | ICD-10-CM | POA: Diagnosis not present

## 2017-08-19 DIAGNOSIS — L669 Cicatricial alopecia, unspecified: Secondary | ICD-10-CM | POA: Diagnosis not present

## 2017-08-19 DIAGNOSIS — L80 Vitiligo: Secondary | ICD-10-CM | POA: Diagnosis not present

## 2017-09-05 DIAGNOSIS — G4733 Obstructive sleep apnea (adult) (pediatric): Secondary | ICD-10-CM | POA: Diagnosis not present

## 2017-09-17 DIAGNOSIS — G4733 Obstructive sleep apnea (adult) (pediatric): Secondary | ICD-10-CM | POA: Diagnosis not present

## 2017-09-17 DIAGNOSIS — Z23 Encounter for immunization: Secondary | ICD-10-CM | POA: Diagnosis not present

## 2017-09-17 DIAGNOSIS — L80 Vitiligo: Secondary | ICD-10-CM | POA: Diagnosis not present

## 2017-09-17 DIAGNOSIS — R1031 Right lower quadrant pain: Secondary | ICD-10-CM | POA: Diagnosis not present

## 2017-09-17 DIAGNOSIS — E039 Hypothyroidism, unspecified: Secondary | ICD-10-CM | POA: Diagnosis not present

## 2017-09-17 DIAGNOSIS — K219 Gastro-esophageal reflux disease without esophagitis: Secondary | ICD-10-CM | POA: Diagnosis not present

## 2017-10-02 IMAGING — MR MR CERVICAL SPINE W/O CM
4 of 5 series · 21 of 48 positions shown · non-contrast
Comparison: 02/08/2012

CLINICAL DATA: Burning pain in the neck with numbness in the upper
back and left arm fatigue. Symptoms on and off for 10 years.

EXAM:
MRI CERVICAL SPINE WITHOUT CONTRAST
TECHNIQUE: Multiplanar, multisequence MR imaging of the cervical spine was
performed. No intravenous contrast was administered.

[Series 3: T2 · sagittal · 3.0mm · 0.41mm/px · 6 of 13 slices shown (1 of 3)]
[im 1/13]
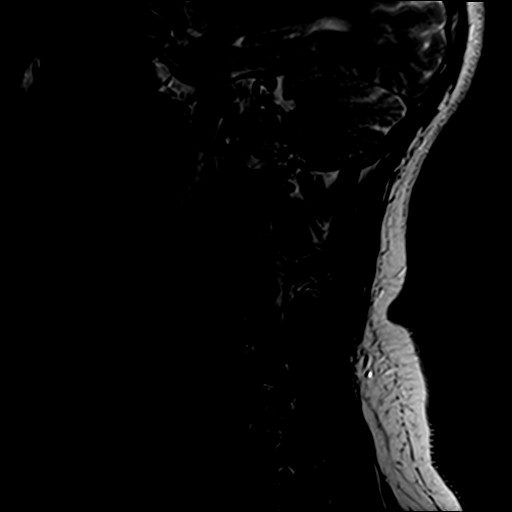
[im 3/13]
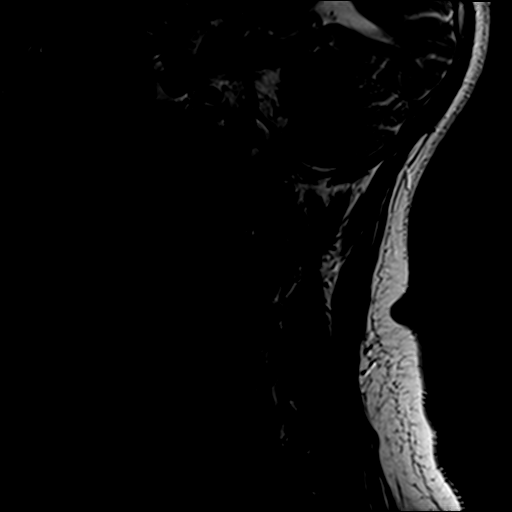
[im 5/13]
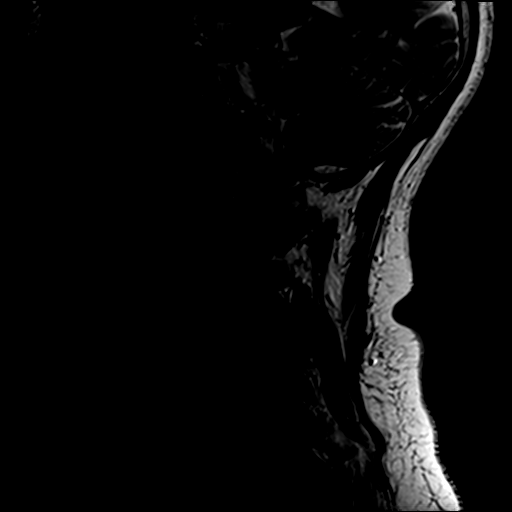
[im 8/13]
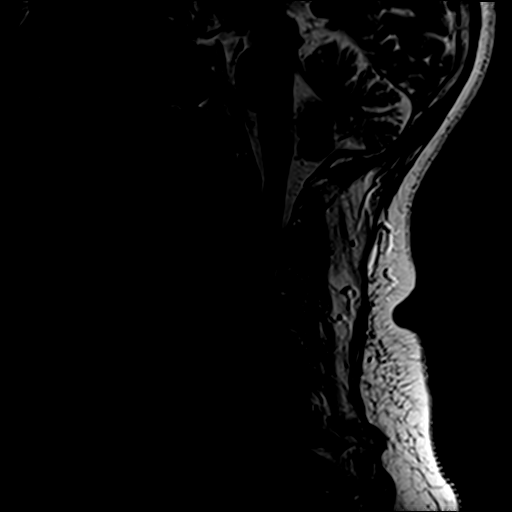
[im 10/13]
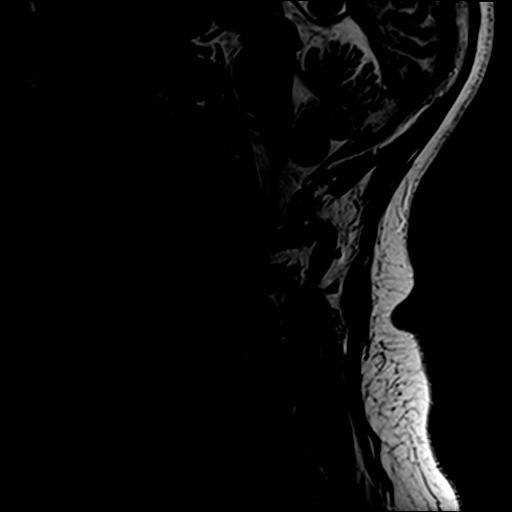
[im 13/13]
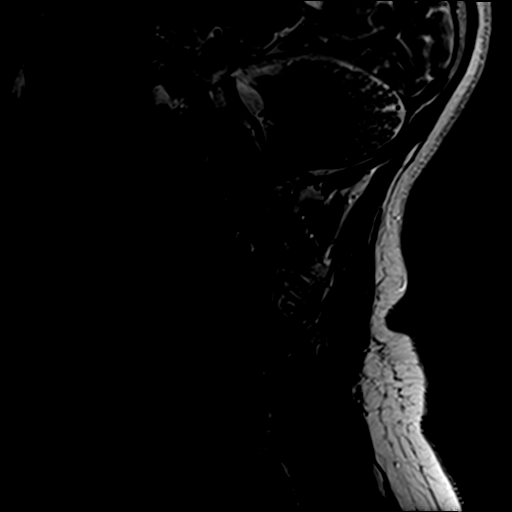

[Series 4: T1 · sagittal · 3.0mm · 0.41mm/px · 3 of 13 slices shown]
[im 3/13]
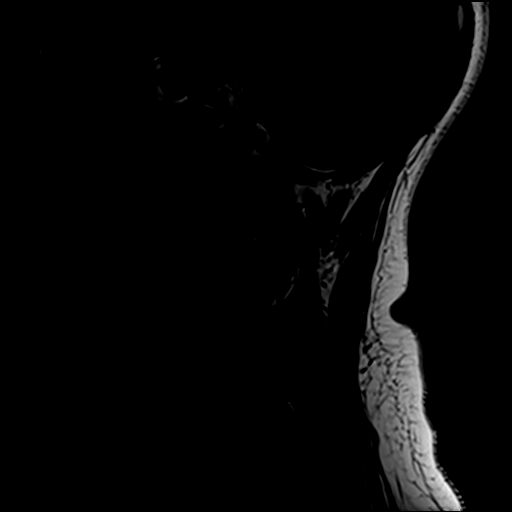
[im 7/13]
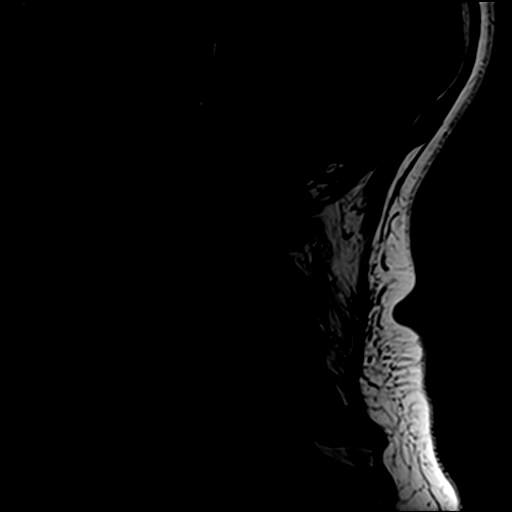
[im 11/13]
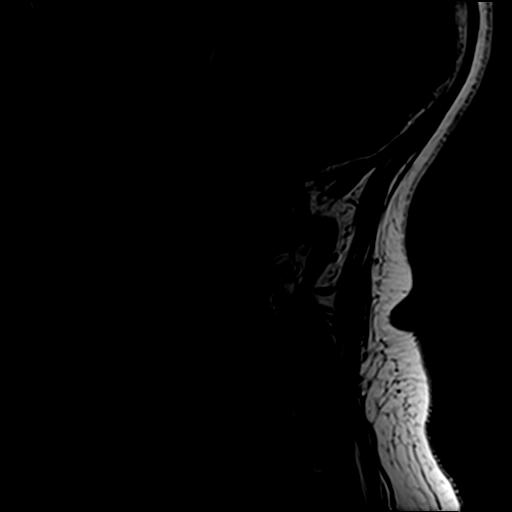

[Series 6: T2 · axial · 3.0mm · 0.39mm/px · z∈[-49,+43]mm · 8 of 26 slices shown (2 of 3)]
[im 1/26]
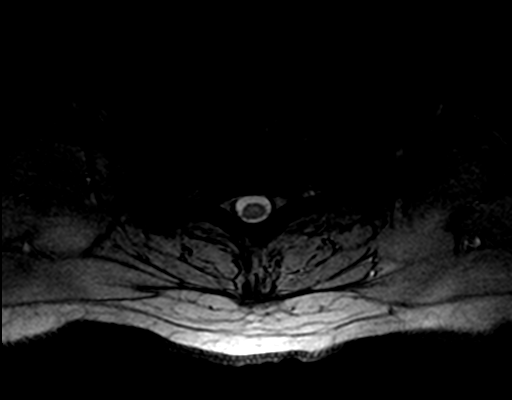
[im 4/26]
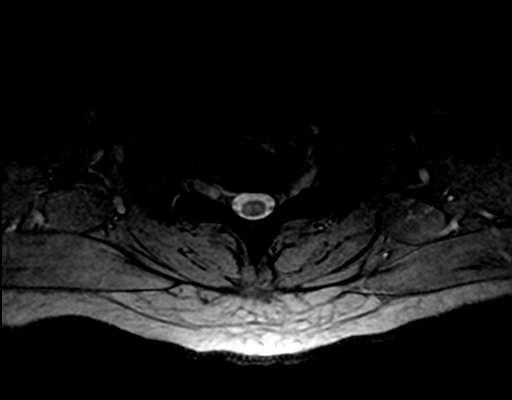
[im 8/26]
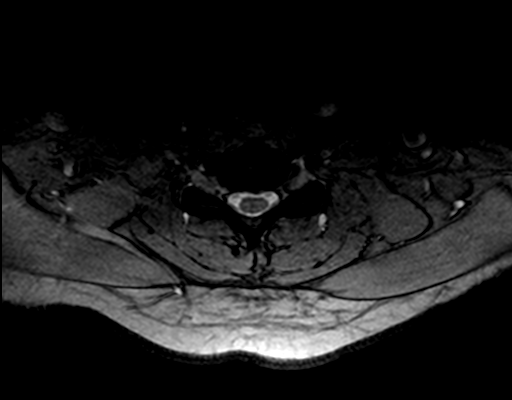
[im 12/26]
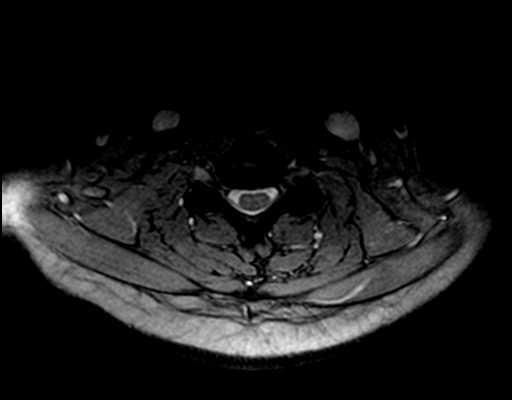
[im 14/26]
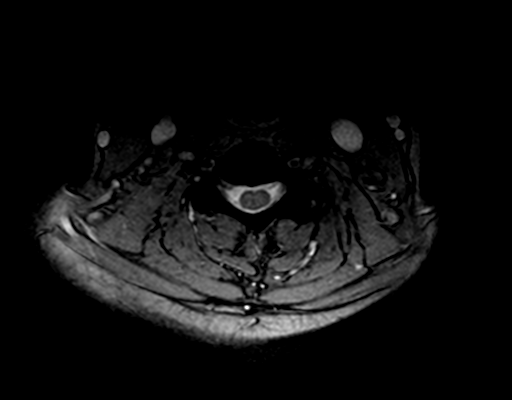
[im 18/26]
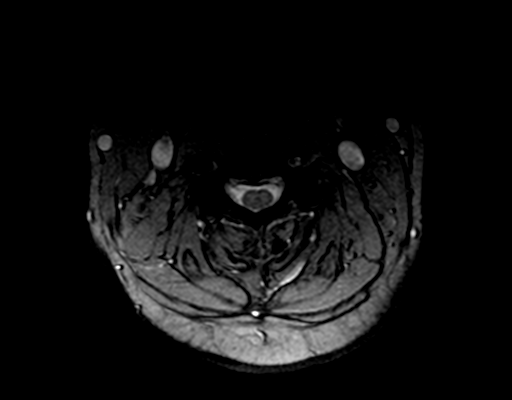
[im 22/26]
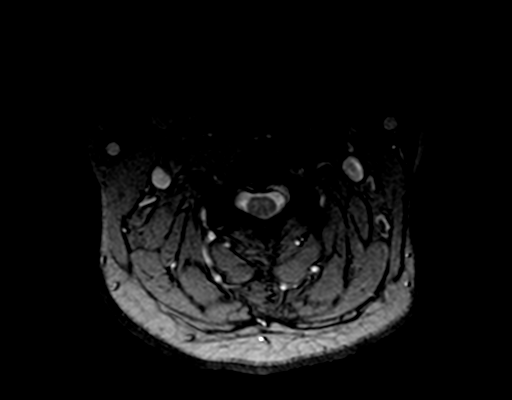
[im 26/26]
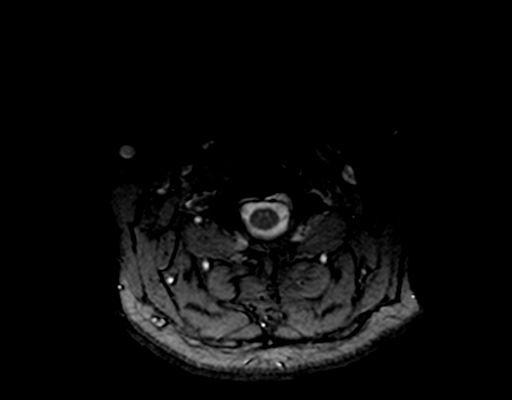

[Series 7: T2 · axial · 3.0mm · 0.39mm/px · z∈[-49,+28]mm · 4 of 26 slices shown (3 of 3)]
[im 1/26]
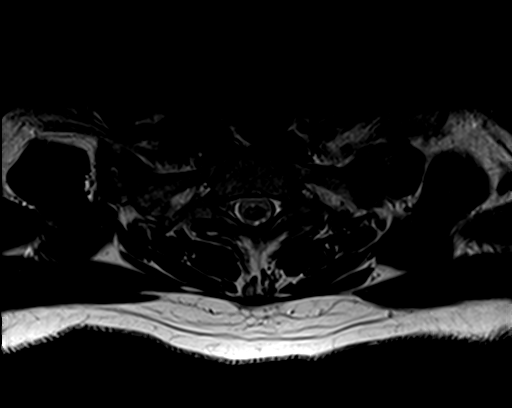
[im 4/26]
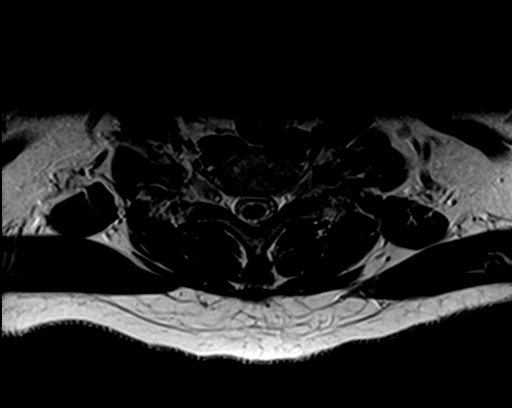
[im 14/26]
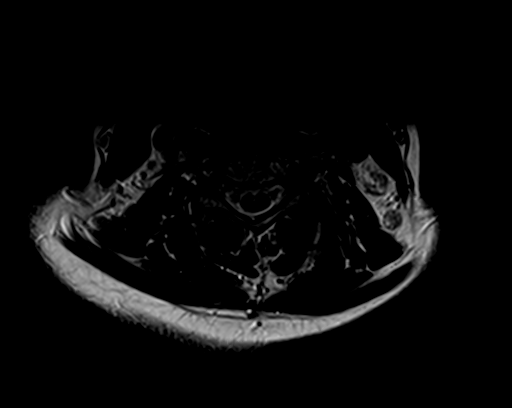
[im 22/26]
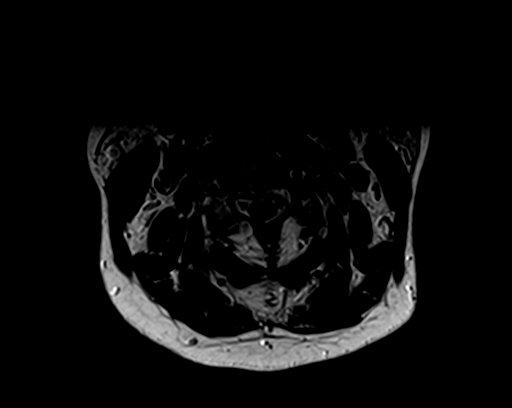

[21 of 48 positions shown; findings below may reference images not displayed]

FINDINGS: Alignment: Chronic 2-3 mm anterolisthesis at C4-5, facet mediated.

Vertebrae: No fracture, evidence of discitis, or bone lesion. Marrow
edema around the left C4-5 facet noted previously is resolved.

Cord: Normal signal and morphology.

Posterior Fossa, vertebral arteries, paraspinal tissues: Patchy T2
hyperintensity in the pons which was subtle if present previously.

Disc levels:

C2-3: Chronic small posterior disc protrusion. Minimal if any facet
spurring. No impingement

C3-4: Facet arthropathy with advanced spurring greater on the left.
Uncovertebral ridging greater on the left. Mild right and moderate
to advanced left foraminal stenosis. No spinal stenosis.

C4-5: Severe facet arthropathy on the left with bulky spurring and
joint distortion. Chronic anterolisthesis. Minor disc bulging.
Patent canal and foramina

C5-6: Facet arthropathy with mild to moderate spurring greater on
the left. Mild disc bulging. No impingement

C6-7: Small central disc protrusion on sagittal acquisition. No
impingement

C7-T1:Negative
IMPRESSION: 1. Cervical facet arthropathy particularly advanced on the left at
C4-5, where there is mild anterolisthesis. Spurring has progressed
since 8971 comparison, although marrow edema on that study is
resolved. Multilevel mild degenerative disc disease.
2. C3-4 moderate to advanced left and mild right foraminal stenosis.
3. No spinal stenosis.
4. Signal abnormality in the pons usually seen with chronic
microvascular disease. Correlate for vascular risk factors.

## 2017-10-03 DIAGNOSIS — G4733 Obstructive sleep apnea (adult) (pediatric): Secondary | ICD-10-CM | POA: Diagnosis not present

## 2017-10-06 DIAGNOSIS — G4733 Obstructive sleep apnea (adult) (pediatric): Secondary | ICD-10-CM | POA: Diagnosis not present

## 2017-10-15 DIAGNOSIS — E039 Hypothyroidism, unspecified: Secondary | ICD-10-CM | POA: Diagnosis not present

## 2017-10-15 DIAGNOSIS — K219 Gastro-esophageal reflux disease without esophagitis: Secondary | ICD-10-CM | POA: Diagnosis not present

## 2017-10-15 DIAGNOSIS — L80 Vitiligo: Secondary | ICD-10-CM | POA: Diagnosis not present

## 2017-10-15 DIAGNOSIS — Z23 Encounter for immunization: Secondary | ICD-10-CM | POA: Diagnosis not present

## 2017-10-15 DIAGNOSIS — G4733 Obstructive sleep apnea (adult) (pediatric): Secondary | ICD-10-CM | POA: Diagnosis not present

## 2017-10-17 DIAGNOSIS — N938 Other specified abnormal uterine and vaginal bleeding: Secondary | ICD-10-CM | POA: Diagnosis not present

## 2017-11-04 DIAGNOSIS — N938 Other specified abnormal uterine and vaginal bleeding: Secondary | ICD-10-CM | POA: Diagnosis not present

## 2017-11-04 DIAGNOSIS — N951 Menopausal and female climacteric states: Secondary | ICD-10-CM | POA: Diagnosis not present

## 2017-11-05 DIAGNOSIS — G4733 Obstructive sleep apnea (adult) (pediatric): Secondary | ICD-10-CM | POA: Diagnosis not present

## 2017-12-11 DIAGNOSIS — H60502 Unspecified acute noninfective otitis externa, left ear: Secondary | ICD-10-CM | POA: Diagnosis not present

## 2017-12-11 DIAGNOSIS — B379 Candidiasis, unspecified: Secondary | ICD-10-CM | POA: Diagnosis not present

## 2017-12-11 DIAGNOSIS — J0100 Acute maxillary sinusitis, unspecified: Secondary | ICD-10-CM | POA: Diagnosis not present

## 2017-12-12 DIAGNOSIS — G4733 Obstructive sleep apnea (adult) (pediatric): Secondary | ICD-10-CM | POA: Diagnosis not present

## 2018-03-28 DIAGNOSIS — E039 Hypothyroidism, unspecified: Secondary | ICD-10-CM | POA: Diagnosis not present

## 2018-03-28 DIAGNOSIS — G4733 Obstructive sleep apnea (adult) (pediatric): Secondary | ICD-10-CM | POA: Diagnosis not present

## 2018-03-28 DIAGNOSIS — F4320 Adjustment disorder, unspecified: Secondary | ICD-10-CM | POA: Diagnosis not present

## 2018-03-28 DIAGNOSIS — F5104 Psychophysiologic insomnia: Secondary | ICD-10-CM | POA: Diagnosis not present

## 2018-04-07 DIAGNOSIS — L089 Local infection of the skin and subcutaneous tissue, unspecified: Secondary | ICD-10-CM | POA: Diagnosis not present

## 2018-04-07 DIAGNOSIS — L669 Cicatricial alopecia, unspecified: Secondary | ICD-10-CM | POA: Diagnosis not present

## 2018-04-07 DIAGNOSIS — L80 Vitiligo: Secondary | ICD-10-CM | POA: Diagnosis not present

## 2018-04-07 DIAGNOSIS — G4733 Obstructive sleep apnea (adult) (pediatric): Secondary | ICD-10-CM | POA: Diagnosis not present

## 2018-04-07 DIAGNOSIS — L658 Other specified nonscarring hair loss: Secondary | ICD-10-CM | POA: Diagnosis not present

## 2018-04-11 DIAGNOSIS — E039 Hypothyroidism, unspecified: Secondary | ICD-10-CM | POA: Diagnosis not present

## 2018-04-16 DIAGNOSIS — G4733 Obstructive sleep apnea (adult) (pediatric): Secondary | ICD-10-CM | POA: Diagnosis not present

## 2018-05-19 DIAGNOSIS — G4733 Obstructive sleep apnea (adult) (pediatric): Secondary | ICD-10-CM | POA: Diagnosis not present

## 2018-05-28 DIAGNOSIS — E039 Hypothyroidism, unspecified: Secondary | ICD-10-CM | POA: Diagnosis not present

## 2018-07-29 DIAGNOSIS — M545 Low back pain: Secondary | ICD-10-CM | POA: Diagnosis not present

## 2018-07-29 DIAGNOSIS — F5104 Psychophysiologic insomnia: Secondary | ICD-10-CM | POA: Diagnosis not present

## 2018-07-29 DIAGNOSIS — E039 Hypothyroidism, unspecified: Secondary | ICD-10-CM | POA: Diagnosis not present

## 2018-07-29 DIAGNOSIS — G4733 Obstructive sleep apnea (adult) (pediatric): Secondary | ICD-10-CM | POA: Diagnosis not present

## 2018-07-29 DIAGNOSIS — F4320 Adjustment disorder, unspecified: Secondary | ICD-10-CM | POA: Diagnosis not present

## 2018-09-09 DIAGNOSIS — G4733 Obstructive sleep apnea (adult) (pediatric): Secondary | ICD-10-CM | POA: Diagnosis not present

## 2018-10-13 DIAGNOSIS — N3942 Incontinence without sensory awareness: Secondary | ICD-10-CM | POA: Diagnosis not present

## 2018-10-13 DIAGNOSIS — E039 Hypothyroidism, unspecified: Secondary | ICD-10-CM | POA: Diagnosis not present

## 2018-10-13 DIAGNOSIS — M545 Low back pain: Secondary | ICD-10-CM | POA: Diagnosis not present

## 2018-10-13 DIAGNOSIS — F4320 Adjustment disorder, unspecified: Secondary | ICD-10-CM | POA: Diagnosis not present

## 2018-10-15 DIAGNOSIS — G4733 Obstructive sleep apnea (adult) (pediatric): Secondary | ICD-10-CM | POA: Diagnosis not present

## 2018-11-12 DIAGNOSIS — K5900 Constipation, unspecified: Secondary | ICD-10-CM | POA: Diagnosis not present

## 2018-11-28 ENCOUNTER — Ambulatory Visit
Admission: RE | Admit: 2018-11-28 | Discharge: 2018-11-28 | Disposition: A | Discharge status: Home / Self Care | Payer: BLUE CROSS/BLUE SHIELD | Source: Ambulatory Visit | Attending: Gastroenterology | Admitting: Gastroenterology

## 2018-11-28 ENCOUNTER — Other Ambulatory Visit: Payer: Self-pay | Admitting: Gastroenterology

## 2018-11-28 DIAGNOSIS — K5900 Constipation, unspecified: Secondary | ICD-10-CM

## 2018-12-16 DIAGNOSIS — G4733 Obstructive sleep apnea (adult) (pediatric): Secondary | ICD-10-CM | POA: Diagnosis not present

## 2019-01-01 DIAGNOSIS — K5900 Constipation, unspecified: Secondary | ICD-10-CM | POA: Diagnosis not present

## 2019-03-05 DIAGNOSIS — F4320 Adjustment disorder, unspecified: Secondary | ICD-10-CM | POA: Diagnosis not present

## 2019-03-05 DIAGNOSIS — E039 Hypothyroidism, unspecified: Secondary | ICD-10-CM | POA: Diagnosis not present

## 2019-03-05 DIAGNOSIS — K219 Gastro-esophageal reflux disease without esophagitis: Secondary | ICD-10-CM | POA: Diagnosis not present

## 2019-03-05 DIAGNOSIS — G4733 Obstructive sleep apnea (adult) (pediatric): Secondary | ICD-10-CM | POA: Diagnosis not present

## 2019-05-01 DIAGNOSIS — K5900 Constipation, unspecified: Secondary | ICD-10-CM | POA: Diagnosis not present

## 2019-06-18 DIAGNOSIS — F4320 Adjustment disorder, unspecified: Secondary | ICD-10-CM | POA: Diagnosis not present

## 2019-06-18 DIAGNOSIS — G4733 Obstructive sleep apnea (adult) (pediatric): Secondary | ICD-10-CM | POA: Diagnosis not present

## 2019-06-18 DIAGNOSIS — E039 Hypothyroidism, unspecified: Secondary | ICD-10-CM | POA: Diagnosis not present

## 2019-06-18 DIAGNOSIS — K219 Gastro-esophageal reflux disease without esophagitis: Secondary | ICD-10-CM | POA: Diagnosis not present

## 2019-06-19 DIAGNOSIS — L80 Vitiligo: Secondary | ICD-10-CM | POA: Diagnosis not present

## 2019-06-19 DIAGNOSIS — Z13220 Encounter for screening for lipoid disorders: Secondary | ICD-10-CM | POA: Diagnosis not present

## 2019-06-19 DIAGNOSIS — E039 Hypothyroidism, unspecified: Secondary | ICD-10-CM | POA: Diagnosis not present

## 2019-09-03 DIAGNOSIS — G4733 Obstructive sleep apnea (adult) (pediatric): Secondary | ICD-10-CM | POA: Diagnosis not present

## 2019-09-07 DIAGNOSIS — Z1231 Encounter for screening mammogram for malignant neoplasm of breast: Secondary | ICD-10-CM | POA: Diagnosis not present

## 2019-09-07 DIAGNOSIS — N939 Abnormal uterine and vaginal bleeding, unspecified: Secondary | ICD-10-CM | POA: Diagnosis not present

## 2019-09-08 DIAGNOSIS — Z20828 Contact with and (suspected) exposure to other viral communicable diseases: Secondary | ICD-10-CM | POA: Diagnosis not present

## 2019-09-09 ENCOUNTER — Other Ambulatory Visit: Payer: Self-pay

## 2019-09-09 DIAGNOSIS — Z20822 Contact with and (suspected) exposure to covid-19: Secondary | ICD-10-CM

## 2019-09-09 DIAGNOSIS — Z20828 Contact with and (suspected) exposure to other viral communicable diseases: Secondary | ICD-10-CM | POA: Diagnosis not present

## 2019-09-11 DIAGNOSIS — K5900 Constipation, unspecified: Secondary | ICD-10-CM | POA: Diagnosis not present

## 2019-09-11 DIAGNOSIS — Z86010 Personal history of colonic polyps: Secondary | ICD-10-CM | POA: Diagnosis not present

## 2019-09-11 LAB — NOVEL CORONAVIRUS, NAA: SARS-CoV-2, NAA: NOT DETECTED

## 2019-10-05 DIAGNOSIS — N950 Postmenopausal bleeding: Secondary | ICD-10-CM | POA: Diagnosis not present

## 2019-10-05 DIAGNOSIS — L292 Pruritus vulvae: Secondary | ICD-10-CM | POA: Diagnosis not present

## 2019-10-05 DIAGNOSIS — N939 Abnormal uterine and vaginal bleeding, unspecified: Secondary | ICD-10-CM | POA: Diagnosis not present

## 2019-10-09 DIAGNOSIS — Z1211 Encounter for screening for malignant neoplasm of colon: Secondary | ICD-10-CM | POA: Diagnosis not present

## 2019-10-09 DIAGNOSIS — Z86010 Personal history of colonic polyps: Secondary | ICD-10-CM | POA: Diagnosis not present

## 2019-10-09 DIAGNOSIS — K5900 Constipation, unspecified: Secondary | ICD-10-CM | POA: Diagnosis not present

## 2019-10-09 DIAGNOSIS — Z8371 Family history of colonic polyps: Secondary | ICD-10-CM | POA: Diagnosis not present

## 2019-10-23 DIAGNOSIS — Z79899 Other long term (current) drug therapy: Secondary | ICD-10-CM | POA: Diagnosis not present

## 2019-10-23 DIAGNOSIS — Z87891 Personal history of nicotine dependence: Secondary | ICD-10-CM | POA: Diagnosis not present

## 2019-10-23 DIAGNOSIS — E039 Hypothyroidism, unspecified: Secondary | ICD-10-CM | POA: Diagnosis not present

## 2019-10-28 DIAGNOSIS — E039 Hypothyroidism, unspecified: Secondary | ICD-10-CM | POA: Diagnosis not present

## 2019-10-28 DIAGNOSIS — Z79899 Other long term (current) drug therapy: Secondary | ICD-10-CM | POA: Diagnosis not present

## 2019-10-28 DIAGNOSIS — Z1211 Encounter for screening for malignant neoplasm of colon: Secondary | ICD-10-CM | POA: Diagnosis not present

## 2019-11-26 DIAGNOSIS — Z03818 Encounter for observation for suspected exposure to other biological agents ruled out: Secondary | ICD-10-CM | POA: Diagnosis not present

## 2019-12-02 DIAGNOSIS — G4733 Obstructive sleep apnea (adult) (pediatric): Secondary | ICD-10-CM | POA: Diagnosis not present

## 2019-12-15 DIAGNOSIS — G4733 Obstructive sleep apnea (adult) (pediatric): Secondary | ICD-10-CM | POA: Diagnosis not present

## 2019-12-23 DIAGNOSIS — J00 Acute nasopharyngitis [common cold]: Secondary | ICD-10-CM | POA: Diagnosis not present

## 2019-12-23 DIAGNOSIS — Z20828 Contact with and (suspected) exposure to other viral communicable diseases: Secondary | ICD-10-CM | POA: Diagnosis not present

## 2020-04-06 DIAGNOSIS — G4733 Obstructive sleep apnea (adult) (pediatric): Secondary | ICD-10-CM | POA: Diagnosis not present

## 2020-04-22 DIAGNOSIS — E6609 Other obesity due to excess calories: Secondary | ICD-10-CM | POA: Diagnosis not present

## 2020-04-22 DIAGNOSIS — Z79899 Other long term (current) drug therapy: Secondary | ICD-10-CM | POA: Diagnosis not present

## 2020-04-22 DIAGNOSIS — Z720 Tobacco use: Secondary | ICD-10-CM | POA: Diagnosis not present

## 2020-04-22 DIAGNOSIS — E039 Hypothyroidism, unspecified: Secondary | ICD-10-CM | POA: Diagnosis not present

## 2020-05-24 DIAGNOSIS — R899 Unspecified abnormal finding in specimens from other organs, systems and tissues: Secondary | ICD-10-CM | POA: Diagnosis not present

## 2020-05-24 DIAGNOSIS — Z13220 Encounter for screening for lipoid disorders: Secondary | ICD-10-CM | POA: Diagnosis not present

## 2020-06-07 DIAGNOSIS — D696 Thrombocytopenia, unspecified: Secondary | ICD-10-CM | POA: Diagnosis not present

## 2020-06-29 ENCOUNTER — Telehealth: Payer: Self-pay | Admitting: Hematology and Oncology

## 2020-06-29 NOTE — Telephone Encounter (Signed)
Received a new hem referral from Dr. Modesto Charon at [place] for thrombocytopenia. Ms. Wolf returned my call to schedule a new hem appt w/Dr. Pamelia Hoit on 8/16 at 345pm. Pt aware to arrive 15 minutes early.

## 2020-07-12 DIAGNOSIS — G4733 Obstructive sleep apnea (adult) (pediatric): Secondary | ICD-10-CM | POA: Diagnosis not present

## 2020-07-12 DIAGNOSIS — R109 Unspecified abdominal pain: Secondary | ICD-10-CM | POA: Diagnosis not present

## 2020-07-12 DIAGNOSIS — Z86010 Personal history of colonic polyps: Secondary | ICD-10-CM | POA: Diagnosis not present

## 2020-07-12 DIAGNOSIS — K5900 Constipation, unspecified: Secondary | ICD-10-CM | POA: Diagnosis not present

## 2020-07-18 ENCOUNTER — Inpatient Hospital Stay: Payer: BC Managed Care – PPO | Attending: Hematology and Oncology | Admitting: Hematology and Oncology

## 2020-07-18 ENCOUNTER — Inpatient Hospital Stay: Payer: BC Managed Care – PPO

## 2020-07-18 ENCOUNTER — Other Ambulatory Visit: Payer: Self-pay

## 2020-07-18 DIAGNOSIS — Z87891 Personal history of nicotine dependence: Secondary | ICD-10-CM

## 2020-07-18 DIAGNOSIS — D693 Immune thrombocytopenic purpura: Secondary | ICD-10-CM | POA: Diagnosis not present

## 2020-07-18 DIAGNOSIS — E0500 Thyrotoxicosis with diffuse goiter without thyrotoxic crisis or storm: Secondary | ICD-10-CM | POA: Diagnosis not present

## 2020-07-18 DIAGNOSIS — L80 Vitiligo: Secondary | ICD-10-CM

## 2020-07-18 DIAGNOSIS — D696 Thrombocytopenia, unspecified: Secondary | ICD-10-CM

## 2020-07-18 LAB — CBC WITH DIFFERENTIAL (CANCER CENTER ONLY)
Abs Immature Granulocytes: 0.01 10*3/uL (ref 0.00–0.07)
Basophils Absolute: 0 10*3/uL (ref 0.0–0.1)
Basophils Relative: 0 %
Eosinophils Absolute: 0.1 10*3/uL (ref 0.0–0.5)
Eosinophils Relative: 2 %
HCT: 42 % (ref 36.0–46.0)
Hemoglobin: 13.8 g/dL (ref 12.0–15.0)
Immature Granulocytes: 0 %
Lymphocytes Relative: 35 %
Lymphs Abs: 2.5 10*3/uL (ref 0.7–4.0)
MCH: 30.9 pg (ref 26.0–34.0)
MCHC: 32.9 g/dL (ref 30.0–36.0)
MCV: 94.2 fL (ref 80.0–100.0)
Monocytes Absolute: 0.6 10*3/uL (ref 0.1–1.0)
Monocytes Relative: 9 %
Neutro Abs: 3.8 10*3/uL (ref 1.7–7.7)
Neutrophils Relative %: 54 %
Platelet Count: 133 10*3/uL — ABNORMAL LOW (ref 150–400)
RBC: 4.46 MIL/uL (ref 3.87–5.11)
RDW: 13 % (ref 11.5–15.5)
WBC Count: 7.1 10*3/uL (ref 4.0–10.5)
nRBC: 0 % (ref 0.0–0.2)

## 2020-07-18 LAB — IMMATURE PLATELET FRACTION: Immature Platelet Fraction: 12.9 % — ABNORMAL HIGH (ref 1.2–8.6)

## 2020-07-18 LAB — PLATELET BY CITRATE

## 2020-07-18 MED ORDER — METAXALONE 800 MG PO TABS: 800.0000 mg | ORAL_TABLET | Freq: Three times a day (TID) | ORAL | Status: DC

## 2020-07-18 MED ORDER — PROBIOTIC 1-250 BILLION-MG PO CAPS: 1.0000 | ORAL_CAPSULE | Freq: Every day | ORAL | Status: AC

## 2020-07-18 MED ORDER — MULTIVITAMIN GUMMIES ADULT PO CHEW: 1.0000 | CHEWABLE_TABLET | Freq: Every day | ORAL | Status: AC

## 2020-07-18 MED ORDER — CELECOXIB 50 MG PO CAPS: 50.0000 mg | ORAL_CAPSULE | Freq: Two times a day (BID) | ORAL | Status: DC

## 2020-07-18 MED ORDER — MELATONIN 3 MG PO TABS: 6.0000 mg | ORAL_TABLET | Freq: Every day | ORAL | 0 refills | Status: AC

## 2020-07-18 MED ORDER — FISH OIL 1200 MG PO CAPS: 1.0000 | ORAL_CAPSULE | Freq: Every day | ORAL | Status: DC

## 2020-07-18 MED ORDER — TRULANCE 3 MG PO TABS: 1.0000 | ORAL_TABLET | Freq: Every day | ORAL | Status: AC

## 2020-07-18 NOTE — Assessment & Plan Note (Signed)
Lab review  04/22/2020: Platelets 112 04/24/2020: Platelets 112 05/24/2020: Platelets 104 (giant platelets on blood smear) MPV 11 05/28/2020: Platelets 108 06/07/2020: Platelets 126, MPV 10.9 (rest of CBC is normal, WBC 5.2, hemoglobin 14.2) 06/10/2020: Platelets 130  Mild thrombocytopenia:   Differential diagnosis: 1. Low-grade ITP 2. medication induced: On further review patient is not taking any medications that are associated with thrombocytopenia 3. Bone marrow factors 4. Hepatitis B/C 5. Splenomegaly: No enlarged spleen was palpable to physical exam.  I discussed with the patient that the level of thrombocytopenia is very mild and that these levels, there are usually no adverse effects. There is usually no risk of bleeding and hence it can be observed without making any changes to patient's medications or requiring any further investigations like bone marrow biopsies.  I recommended watchful monitoring. We can see the patient back in one year with labs and follow-up.

## 2020-07-18 NOTE — Progress Notes (Signed)
[facility] CONSULT NOTE  Patient Care Team: Ileana Ladd, MD as PCP - General (Family Medicine)  CHIEF COMPLAINTS/PURPOSE OF CONSULTATION:  Newly diagnosed thrombocytopenia  HISTORY OF PRESENTING ILLNESS:  April Elliott 54 y.o. female is here because of recent diagnosis of thrombocytopenia. She is referred by Dr. Modesto Charon at [facility]. Labs on 06/07/20 showed Hg 14.2, HCT 41.5, platelets 126. She presents to the clinic today for initial evaluation.  In 2018 she had a platelet count of 128 and over the years her platelet counts have fluctuated between 110-130.  She has not had any bruising or bleeding symptoms.  Occasionally they have noticed that there was platelet clumping or giant platelets on the previous blood work. Her only symptom is burning sensation on the tongue I reviewed her records extensively and collaborated the history with the patient.  MEDICAL HISTORY: History of Graves' disease status post radiation to the thyroid, vitiligo Vitiligo SURGICAL HISTORY: Benign cyst removal in the breast  SOCIAL HISTORY: Quit tobacco 1 year ago half a pack a day for 20 years, drinks alcohol 2-3 times a month FAMILY HISTORY: No family history of blood disorders, head and neck cancer maternal grandfather  ALLERGIES:  has No Known Allergies.  MEDICATIONS:  Current Outpatient Medications  Medication Sig Dispense Refill  . citalopram (CELEXA) 40 MG tablet     . clonazePAM (KLONOPIN) 0.5 MG tablet TAKE 1 TABLET BY MOUTH EVERY DAY FOR ANXIETY  1  . clonazePAM (KLONOPIN) 0.5 MG tablet     . cyclobenzaprine (FLEXERIL) 10 MG tablet     . Fexofenadine HCl (ALLEGRA PO) [facility] Allergy    . finasteride (PROSCAR) 5 MG tablet TAKE 0.5 TABLETS (2.5 MG TOTAL) BY MOUTH DAILY.  3  . fluticasone (FLONASE ALLERGY RELIEF) 50 MCG/ACT nasal spray Flonase Allergy Relief    . gabapentin (NEURONTIN) 300 MG capsule 1 CAPSULE EVERY MORNING , 1 AFTERNOON, 2 QHS, ORALLY 30 DAYS  11  . gabapentin  (NEURONTIN) 300 MG capsule gabapentin 300 mg capsule  1 CAPSULE EVERY MORNING , 1 AFTERNOON, 2 QHS, ORALLY 30 DAYS    . Ibuprofen-Famotidine (DUEXIS) 800-26.6 MG TABS Duexis 800 mg-26.6 mg tablet    . liothyronine (CYTOMEL) 5 MCG tablet liothyronine 5 mcg tablet    . NONFORMULARY OR COMPOUNDED ITEM [facility]  Onychomycosis Nail Lacquer -  Fluconazole 2%, Terbinafine 1% DMSO Apply to affected nail once daily Qty. 120 gm 3 refills 1 each 0  . SYNTHROID 100 MCG tablet TAKE 1 TABLET BY MOUTH EVERY MORNING ON AN EMPTY STOMACH  2   No current facility-administered medications for this visit.    REVIEW OF SYSTEMS:      PHYSICAL EXAMINATION: ECOG PERFORMANCE STATUS: 1 - Symptomatic but completely ambulatory  Vitals:   07/18/20 1545  BP: 129/76  Pulse: 86  Resp: 17  Temp: 98.4 F (36.9 C)  SpO2: 97%   Filed Weights   07/18/20 1545  Weight: 217 lb 4.8 oz (98.6 kg)    Review of systems as above all systems negative  LABORATORY DATA:  I have reviewed the data as listed Lab Results  Component Value Date   WBC 7.8 02/23/2009   HGB 14.3 02/23/2009   HCT 42.5 02/23/2009   MCV 99.1 02/23/2009   PLT 142 (L) 02/23/2009   No results found for: NA, K, CL, CO2  RADIOGRAPHIC STUDIES: I have personally reviewed the radiological reports and agreed with the findings in the report.  ASSESSMENT AND PLAN:  Thrombocytopenia (HCC)  Lab review  04/01/2017: 123 October 2018: 143 10/28/2019: 129 04/22/2020: Platelets 112 04/24/2020: Platelets 112 05/24/2020: Platelets 104 (giant platelets on blood smear) MPV 11 05/28/2020: Platelets 108 06/07/2020: Platelets 126, MPV 10.9 (rest of CBC is normal, WBC 5.2, hemoglobin 14.2) 06/10/2020: Platelets 130  Mild thrombocytopenia:  Labs 816.21: Platelets 133, immature platelet fraction 12.9 (markedly elevated) this suggests chronic low-grade ITP  Differential diagnosis: 1. Low-grade ITP: Patient has vitiligo, Graves' disease history and  therefore autoimmune etiologies of most likely cause 2. medication induced: On further review patient is not taking any medications that are associated with thrombocytopenia 3. Bone marrow factors: Unlikely because the rest of the blood counts are normal 4. Hepatitis B/C: No risk factors 5. Splenomegaly: No enlarged spleen  6.  Spurious thrombocytopenia due to platelet clumping: We obtain a CBC and a platelets by citrate.  I discussed with the patient that the level of thrombocytopenia is very mild and that these levels, there are usually no adverse effects. There is usually no risk of bleeding and hence it can be observed without making any changes to patient's medications or requiring any further investigations like bone marrow biopsies.  I recommended watchful monitoring. We can see the patient on an as-needed basis.    All questions were answered. The patient knows to call the clinic with any problems, questions or concerns.   Sabas Sous, MD, MPH 07/18/2020    I, Molly Dorshimer, am acting as scribe for Serena Croissant, MD.  I have reviewed the above documentation for accuracy and completeness, and I agree with the above.

## 2020-07-19 ENCOUNTER — Telehealth: Payer: Self-pay | Admitting: Hematology and Oncology

## 2020-07-19 NOTE — Telephone Encounter (Signed)
No 8/16 los, no changes made to pt schedule   

## 2020-08-25 DIAGNOSIS — M545 Low back pain: Secondary | ICD-10-CM | POA: Diagnosis not present

## 2020-08-25 DIAGNOSIS — M4802 Spinal stenosis, cervical region: Secondary | ICD-10-CM | POA: Diagnosis not present

## 2020-08-25 DIAGNOSIS — D696 Thrombocytopenia, unspecified: Secondary | ICD-10-CM | POA: Diagnosis not present

## 2020-08-25 DIAGNOSIS — F4323 Adjustment disorder with mixed anxiety and depressed mood: Secondary | ICD-10-CM | POA: Diagnosis not present

## 2020-08-25 DIAGNOSIS — F4329 Adjustment disorder with other symptoms: Secondary | ICD-10-CM | POA: Diagnosis not present

## 2020-08-30 DIAGNOSIS — F4329 Adjustment disorder with other symptoms: Secondary | ICD-10-CM | POA: Diagnosis not present

## 2020-08-30 DIAGNOSIS — M545 Low back pain: Secondary | ICD-10-CM | POA: Diagnosis not present

## 2020-08-30 DIAGNOSIS — D696 Thrombocytopenia, unspecified: Secondary | ICD-10-CM | POA: Diagnosis not present

## 2020-08-30 DIAGNOSIS — M4802 Spinal stenosis, cervical region: Secondary | ICD-10-CM | POA: Diagnosis not present

## 2020-08-30 DIAGNOSIS — E039 Hypothyroidism, unspecified: Secondary | ICD-10-CM | POA: Diagnosis not present

## 2020-08-30 DIAGNOSIS — Z23 Encounter for immunization: Secondary | ICD-10-CM | POA: Diagnosis not present

## 2020-09-08 DIAGNOSIS — F4323 Adjustment disorder with mixed anxiety and depressed mood: Secondary | ICD-10-CM | POA: Diagnosis not present

## 2020-09-15 DIAGNOSIS — F4323 Adjustment disorder with mixed anxiety and depressed mood: Secondary | ICD-10-CM | POA: Diagnosis not present

## 2020-09-16 DIAGNOSIS — E039 Hypothyroidism, unspecified: Secondary | ICD-10-CM | POA: Diagnosis not present

## 2020-09-16 DIAGNOSIS — D696 Thrombocytopenia, unspecified: Secondary | ICD-10-CM | POA: Diagnosis not present

## 2020-09-16 DIAGNOSIS — M4802 Spinal stenosis, cervical region: Secondary | ICD-10-CM | POA: Diagnosis not present

## 2020-09-16 DIAGNOSIS — F4329 Adjustment disorder with other symptoms: Secondary | ICD-10-CM | POA: Diagnosis not present

## 2020-09-22 DIAGNOSIS — F4323 Adjustment disorder with mixed anxiety and depressed mood: Secondary | ICD-10-CM | POA: Diagnosis not present

## 2020-09-30 DIAGNOSIS — F4323 Adjustment disorder with mixed anxiety and depressed mood: Secondary | ICD-10-CM | POA: Diagnosis not present

## 2020-10-07 DIAGNOSIS — K5900 Constipation, unspecified: Secondary | ICD-10-CM | POA: Diagnosis not present

## 2020-10-07 DIAGNOSIS — F4323 Adjustment disorder with mixed anxiety and depressed mood: Secondary | ICD-10-CM | POA: Diagnosis not present

## 2020-10-13 DIAGNOSIS — F4329 Adjustment disorder with other symptoms: Secondary | ICD-10-CM | POA: Diagnosis not present

## 2020-10-13 DIAGNOSIS — E039 Hypothyroidism, unspecified: Secondary | ICD-10-CM | POA: Diagnosis not present

## 2020-10-13 DIAGNOSIS — M4802 Spinal stenosis, cervical region: Secondary | ICD-10-CM | POA: Diagnosis not present

## 2020-10-13 DIAGNOSIS — D696 Thrombocytopenia, unspecified: Secondary | ICD-10-CM | POA: Diagnosis not present

## 2020-11-04 DIAGNOSIS — F4323 Adjustment disorder with mixed anxiety and depressed mood: Secondary | ICD-10-CM | POA: Diagnosis not present

## 2020-11-10 DIAGNOSIS — G4733 Obstructive sleep apnea (adult) (pediatric): Secondary | ICD-10-CM | POA: Diagnosis not present

## 2020-11-28 DIAGNOSIS — Z03818 Encounter for observation for suspected exposure to other biological agents ruled out: Secondary | ICD-10-CM | POA: Diagnosis not present

## 2020-11-28 DIAGNOSIS — Z7189 Other specified counseling: Secondary | ICD-10-CM | POA: Diagnosis not present

## 2020-11-28 DIAGNOSIS — Z20822 Contact with and (suspected) exposure to covid-19: Secondary | ICD-10-CM | POA: Diagnosis not present

## 2020-11-28 DIAGNOSIS — R059 Cough, unspecified: Secondary | ICD-10-CM | POA: Diagnosis not present

## 2020-12-06 DIAGNOSIS — Z780 Asymptomatic menopausal state: Secondary | ICD-10-CM | POA: Insufficient documentation

## 2020-12-06 DIAGNOSIS — Z01419 Encounter for gynecological examination (general) (routine) without abnormal findings: Secondary | ICD-10-CM | POA: Diagnosis not present

## 2020-12-06 DIAGNOSIS — Z6834 Body mass index (BMI) 34.0-34.9, adult: Secondary | ICD-10-CM | POA: Diagnosis not present

## 2020-12-06 DIAGNOSIS — Z1389 Encounter for screening for other disorder: Secondary | ICD-10-CM | POA: Diagnosis not present

## 2020-12-06 DIAGNOSIS — N3941 Urge incontinence: Secondary | ICD-10-CM | POA: Insufficient documentation

## 2020-12-06 DIAGNOSIS — Z130 Encounter for screening for diseases of the blood and blood-forming organs and certain disorders involving the immune mechanism: Secondary | ICD-10-CM | POA: Diagnosis not present

## 2020-12-09 DIAGNOSIS — F4322 Adjustment disorder with anxiety: Secondary | ICD-10-CM | POA: Diagnosis not present

## 2020-12-13 DIAGNOSIS — G4733 Obstructive sleep apnea (adult) (pediatric): Secondary | ICD-10-CM | POA: Diagnosis not present

## 2020-12-26 DIAGNOSIS — Z1231 Encounter for screening mammogram for malignant neoplasm of breast: Secondary | ICD-10-CM | POA: Diagnosis not present

## 2021-01-03 ENCOUNTER — Other Ambulatory Visit: Payer: Self-pay | Admitting: Obstetrics and Gynecology

## 2021-01-03 DIAGNOSIS — N6489 Other specified disorders of breast: Secondary | ICD-10-CM

## 2021-01-03 DIAGNOSIS — Z20828 Contact with and (suspected) exposure to other viral communicable diseases: Secondary | ICD-10-CM | POA: Diagnosis not present

## 2021-01-03 DIAGNOSIS — R059 Cough, unspecified: Secondary | ICD-10-CM | POA: Diagnosis not present

## 2021-01-06 DIAGNOSIS — F4322 Adjustment disorder with anxiety: Secondary | ICD-10-CM | POA: Diagnosis not present

## 2021-01-23 ENCOUNTER — Other Ambulatory Visit: Payer: Self-pay

## 2021-01-23 ENCOUNTER — Ambulatory Visit
Admission: RE | Admit: 2021-01-23 | Discharge: 2021-01-23 | Disposition: A | Discharge status: Home / Self Care | Payer: BC Managed Care – PPO | Source: Ambulatory Visit | Attending: Obstetrics and Gynecology | Admitting: Obstetrics and Gynecology

## 2021-01-23 ENCOUNTER — Ambulatory Visit: Payer: BC Managed Care – PPO

## 2021-01-23 DIAGNOSIS — R928 Other abnormal and inconclusive findings on diagnostic imaging of breast: Secondary | ICD-10-CM | POA: Diagnosis not present

## 2021-01-23 DIAGNOSIS — N6489 Other specified disorders of breast: Secondary | ICD-10-CM

## 2021-02-03 DIAGNOSIS — F4322 Adjustment disorder with anxiety: Secondary | ICD-10-CM | POA: Diagnosis not present

## 2021-02-13 DIAGNOSIS — D696 Thrombocytopenia, unspecified: Secondary | ICD-10-CM | POA: Diagnosis not present

## 2021-02-13 DIAGNOSIS — M4802 Spinal stenosis, cervical region: Secondary | ICD-10-CM | POA: Diagnosis not present

## 2021-02-13 DIAGNOSIS — Z5181 Encounter for therapeutic drug level monitoring: Secondary | ICD-10-CM | POA: Diagnosis not present

## 2021-02-13 DIAGNOSIS — E039 Hypothyroidism, unspecified: Secondary | ICD-10-CM | POA: Diagnosis not present

## 2021-02-13 DIAGNOSIS — F4329 Adjustment disorder with other symptoms: Secondary | ICD-10-CM | POA: Diagnosis not present

## 2021-03-15 DIAGNOSIS — F4322 Adjustment disorder with anxiety: Secondary | ICD-10-CM | POA: Diagnosis not present

## 2021-03-16 DIAGNOSIS — G4733 Obstructive sleep apnea (adult) (pediatric): Secondary | ICD-10-CM | POA: Diagnosis not present

## 2021-03-22 DIAGNOSIS — F4322 Adjustment disorder with anxiety: Secondary | ICD-10-CM | POA: Diagnosis not present

## 2021-03-29 ENCOUNTER — Other Ambulatory Visit: Payer: Self-pay

## 2021-03-29 ENCOUNTER — Ambulatory Visit: Payer: BC Managed Care – PPO | Admitting: Podiatry

## 2021-03-29 DIAGNOSIS — G2581 Restless legs syndrome: Secondary | ICD-10-CM | POA: Insufficient documentation

## 2021-03-29 DIAGNOSIS — Z720 Tobacco use: Secondary | ICD-10-CM | POA: Insufficient documentation

## 2021-03-29 DIAGNOSIS — B353 Tinea pedis: Secondary | ICD-10-CM

## 2021-03-29 DIAGNOSIS — Z1211 Encounter for screening for malignant neoplasm of colon: Secondary | ICD-10-CM | POA: Insufficient documentation

## 2021-03-29 DIAGNOSIS — F3342 Major depressive disorder, recurrent, in full remission: Secondary | ICD-10-CM | POA: Insufficient documentation

## 2021-03-29 DIAGNOSIS — N3942 Incontinence without sensory awareness: Secondary | ICD-10-CM | POA: Insufficient documentation

## 2021-03-29 DIAGNOSIS — M4802 Spinal stenosis, cervical region: Secondary | ICD-10-CM | POA: Insufficient documentation

## 2021-03-29 DIAGNOSIS — R109 Unspecified abdominal pain: Secondary | ICD-10-CM | POA: Insufficient documentation

## 2021-03-29 DIAGNOSIS — N939 Abnormal uterine and vaginal bleeding, unspecified: Secondary | ICD-10-CM | POA: Insufficient documentation

## 2021-03-29 DIAGNOSIS — R0683 Snoring: Secondary | ICD-10-CM | POA: Insufficient documentation

## 2021-03-29 DIAGNOSIS — Z860100 Personal history of colon polyps, unspecified: Secondary | ICD-10-CM | POA: Insufficient documentation

## 2021-03-29 DIAGNOSIS — F5104 Psychophysiologic insomnia: Secondary | ICD-10-CM | POA: Insufficient documentation

## 2021-03-29 DIAGNOSIS — E039 Hypothyroidism, unspecified: Secondary | ICD-10-CM | POA: Insufficient documentation

## 2021-03-29 DIAGNOSIS — R899 Unspecified abnormal finding in specimens from other organs, systems and tissues: Secondary | ICD-10-CM | POA: Insufficient documentation

## 2021-03-29 DIAGNOSIS — Z8371 Family history of colonic polyps: Secondary | ICD-10-CM | POA: Insufficient documentation

## 2021-03-29 DIAGNOSIS — R1031 Right lower quadrant pain: Secondary | ICD-10-CM | POA: Insufficient documentation

## 2021-03-29 DIAGNOSIS — F419 Anxiety disorder, unspecified: Secondary | ICD-10-CM | POA: Insufficient documentation

## 2021-03-29 DIAGNOSIS — M21611 Bunion of right foot: Secondary | ICD-10-CM | POA: Insufficient documentation

## 2021-03-29 DIAGNOSIS — R400 Somnolence: Secondary | ICD-10-CM | POA: Insufficient documentation

## 2021-03-29 DIAGNOSIS — F4320 Adjustment disorder, unspecified: Secondary | ICD-10-CM | POA: Insufficient documentation

## 2021-03-29 DIAGNOSIS — K219 Gastro-esophageal reflux disease without esophagitis: Secondary | ICD-10-CM | POA: Insufficient documentation

## 2021-03-29 DIAGNOSIS — E669 Obesity, unspecified: Secondary | ICD-10-CM | POA: Insufficient documentation

## 2021-03-29 DIAGNOSIS — Z87891 Personal history of nicotine dependence: Secondary | ICD-10-CM | POA: Insufficient documentation

## 2021-03-29 DIAGNOSIS — H9190 Unspecified hearing loss, unspecified ear: Secondary | ICD-10-CM | POA: Insufficient documentation

## 2021-03-29 DIAGNOSIS — Z0000 Encounter for general adult medical examination without abnormal findings: Secondary | ICD-10-CM | POA: Insufficient documentation

## 2021-03-29 DIAGNOSIS — L603 Nail dystrophy: Secondary | ICD-10-CM | POA: Insufficient documentation

## 2021-03-29 DIAGNOSIS — Z111 Encounter for screening for respiratory tuberculosis: Secondary | ICD-10-CM | POA: Insufficient documentation

## 2021-03-29 DIAGNOSIS — F439 Reaction to severe stress, unspecified: Secondary | ICD-10-CM | POA: Insufficient documentation

## 2021-03-29 DIAGNOSIS — J301 Allergic rhinitis due to pollen: Secondary | ICD-10-CM | POA: Insufficient documentation

## 2021-03-29 DIAGNOSIS — M79671 Pain in right foot: Secondary | ICD-10-CM | POA: Insufficient documentation

## 2021-03-29 DIAGNOSIS — G4733 Obstructive sleep apnea (adult) (pediatric): Secondary | ICD-10-CM | POA: Insufficient documentation

## 2021-03-29 DIAGNOSIS — S8000XA Contusion of unspecified knee, initial encounter: Secondary | ICD-10-CM | POA: Insufficient documentation

## 2021-03-29 DIAGNOSIS — B351 Tinea unguium: Secondary | ICD-10-CM

## 2021-03-29 DIAGNOSIS — M545 Low back pain, unspecified: Secondary | ICD-10-CM | POA: Insufficient documentation

## 2021-03-29 DIAGNOSIS — R600 Localized edema: Secondary | ICD-10-CM | POA: Insufficient documentation

## 2021-03-29 DIAGNOSIS — M542 Cervicalgia: Secondary | ICD-10-CM | POA: Insufficient documentation

## 2021-03-29 DIAGNOSIS — K5900 Constipation, unspecified: Secondary | ICD-10-CM | POA: Insufficient documentation

## 2021-03-29 MED ORDER — CLOTRIMAZOLE-BETAMETHASONE 1-0.05 % EX CREA: 1.0000 "application " | TOPICAL_CREAM | Freq: Two times a day (BID) | CUTANEOUS | 2 refills | Status: DC

## 2021-03-29 MED ORDER — TERBINAFINE HCL 250 MG PO TABS: 250.0000 mg | ORAL_TABLET | Freq: Every day | ORAL | 0 refills | Status: DC

## 2021-03-29 NOTE — Progress Notes (Signed)
   HPI: 55 y.o. female presenting today as a reestablish new patient for evaluation of peeling and itching skin to the bilateral feet.  Patient notices that her skin is flaking with itching.  She is also concerned she because she has a thickened discolored nail to the right second toe with some streaking.  She is concerned for possible fungus.  She presents for further treatment and evaluation  No past medical history on file.   Physical Exam: General: The patient is alert and oriented x3 in no acute distress.  Dermatology: Skin is warm, dry and supple bilateral lower extremities. Negative for open lesions or macerations.  Diffuse hyperkeratosis of skin with peeling noted.  The right second digit nail plate does appear to have some thickening with discoloration consistent with onychomycosis of the toenail plate  Vascular: Palpable pedal pulses bilaterally. No edema or erythema noted. Capillary refill within normal limits.  Neurological: Epicritic and protective threshold grossly intact bilaterally.   Musculoskeletal Exam: Range of motion within normal limits to all pedal and ankle joints bilateral. Muscle strength 5/5 in all groups bilateral.    Assessment: 1.  Onychomycosis of toenail right second digit Tinea pedis bilateral   Plan of Care:  1. Patient evaluated.  2.  Prescription for Lotrisone cream apply 2 times daily 3.  Prescription for Lamisil 250 mg #30 daily 4.  Recommend good foot hygiene and wearing socks and good sneakers  5.  Return to clinic as needed      [name], DPM [facility]  Dr. [name], DPM    [address] [place], [place] [address]                Office [phone]  Fax [phone]

## 2021-03-30 DIAGNOSIS — F331 Major depressive disorder, recurrent, moderate: Secondary | ICD-10-CM | POA: Diagnosis not present

## 2021-04-07 DIAGNOSIS — F331 Major depressive disorder, recurrent, moderate: Secondary | ICD-10-CM | POA: Diagnosis not present

## 2021-04-14 DIAGNOSIS — F331 Major depressive disorder, recurrent, moderate: Secondary | ICD-10-CM | POA: Diagnosis not present

## 2021-04-20 DIAGNOSIS — F331 Major depressive disorder, recurrent, moderate: Secondary | ICD-10-CM | POA: Diagnosis not present

## 2021-04-25 DIAGNOSIS — R059 Cough, unspecified: Secondary | ICD-10-CM | POA: Diagnosis not present

## 2021-04-25 DIAGNOSIS — Z03818 Encounter for observation for suspected exposure to other biological agents ruled out: Secondary | ICD-10-CM | POA: Diagnosis not present

## 2021-04-25 DIAGNOSIS — J3489 Other specified disorders of nose and nasal sinuses: Secondary | ICD-10-CM | POA: Diagnosis not present

## 2021-04-25 DIAGNOSIS — B373 Candidiasis of vulva and vagina: Secondary | ICD-10-CM | POA: Diagnosis not present

## 2021-04-27 DIAGNOSIS — F331 Major depressive disorder, recurrent, moderate: Secondary | ICD-10-CM | POA: Diagnosis not present

## 2021-05-04 DIAGNOSIS — F331 Major depressive disorder, recurrent, moderate: Secondary | ICD-10-CM | POA: Diagnosis not present

## 2021-05-11 DIAGNOSIS — F331 Major depressive disorder, recurrent, moderate: Secondary | ICD-10-CM | POA: Diagnosis not present

## 2021-05-17 DIAGNOSIS — F331 Major depressive disorder, recurrent, moderate: Secondary | ICD-10-CM | POA: Diagnosis not present

## 2021-05-18 DIAGNOSIS — D696 Thrombocytopenia, unspecified: Secondary | ICD-10-CM | POA: Diagnosis not present

## 2021-05-18 DIAGNOSIS — F4329 Adjustment disorder with other symptoms: Secondary | ICD-10-CM | POA: Diagnosis not present

## 2021-05-18 DIAGNOSIS — E039 Hypothyroidism, unspecified: Secondary | ICD-10-CM | POA: Diagnosis not present

## 2021-05-18 DIAGNOSIS — M4802 Spinal stenosis, cervical region: Secondary | ICD-10-CM | POA: Diagnosis not present

## 2021-05-18 DIAGNOSIS — E559 Vitamin D deficiency, unspecified: Secondary | ICD-10-CM | POA: Diagnosis not present

## 2021-05-24 DIAGNOSIS — F331 Major depressive disorder, recurrent, moderate: Secondary | ICD-10-CM | POA: Diagnosis not present

## 2021-05-31 DIAGNOSIS — F331 Major depressive disorder, recurrent, moderate: Secondary | ICD-10-CM | POA: Diagnosis not present

## 2021-06-07 DIAGNOSIS — F331 Major depressive disorder, recurrent, moderate: Secondary | ICD-10-CM | POA: Diagnosis not present

## 2021-06-14 DIAGNOSIS — G4733 Obstructive sleep apnea (adult) (pediatric): Secondary | ICD-10-CM | POA: Diagnosis not present

## 2021-06-15 DIAGNOSIS — F331 Major depressive disorder, recurrent, moderate: Secondary | ICD-10-CM | POA: Diagnosis not present

## 2021-06-21 DIAGNOSIS — F331 Major depressive disorder, recurrent, moderate: Secondary | ICD-10-CM | POA: Diagnosis not present

## 2021-06-28 DIAGNOSIS — F331 Major depressive disorder, recurrent, moderate: Secondary | ICD-10-CM | POA: Diagnosis not present

## 2021-10-06 DIAGNOSIS — F331 Major depressive disorder, recurrent, moderate: Secondary | ICD-10-CM | POA: Diagnosis not present

## 2021-10-12 DIAGNOSIS — L659 Nonscarring hair loss, unspecified: Secondary | ICD-10-CM | POA: Diagnosis not present

## 2021-10-12 DIAGNOSIS — L669 Cicatricial alopecia, unspecified: Secondary | ICD-10-CM | POA: Diagnosis not present

## 2021-10-12 DIAGNOSIS — L80 Vitiligo: Secondary | ICD-10-CM | POA: Diagnosis not present

## 2021-10-12 DIAGNOSIS — L089 Local infection of the skin and subcutaneous tissue, unspecified: Secondary | ICD-10-CM | POA: Diagnosis not present

## 2021-10-20 DIAGNOSIS — F331 Major depressive disorder, recurrent, moderate: Secondary | ICD-10-CM | POA: Diagnosis not present

## 2021-10-31 DIAGNOSIS — L80 Vitiligo: Secondary | ICD-10-CM | POA: Diagnosis not present

## 2021-11-03 DIAGNOSIS — F331 Major depressive disorder, recurrent, moderate: Secondary | ICD-10-CM | POA: Diagnosis not present

## 2021-11-03 DIAGNOSIS — L80 Vitiligo: Secondary | ICD-10-CM | POA: Diagnosis not present

## 2021-11-10 DIAGNOSIS — L80 Vitiligo: Secondary | ICD-10-CM | POA: Diagnosis not present

## 2021-11-14 DIAGNOSIS — L80 Vitiligo: Secondary | ICD-10-CM | POA: Diagnosis not present

## 2021-11-17 DIAGNOSIS — L80 Vitiligo: Secondary | ICD-10-CM | POA: Diagnosis not present

## 2021-11-17 DIAGNOSIS — F331 Major depressive disorder, recurrent, moderate: Secondary | ICD-10-CM | POA: Diagnosis not present

## 2021-11-21 DIAGNOSIS — L80 Vitiligo: Secondary | ICD-10-CM | POA: Diagnosis not present

## 2021-11-28 DIAGNOSIS — L80 Vitiligo: Secondary | ICD-10-CM | POA: Diagnosis not present

## 2021-12-05 DIAGNOSIS — L80 Vitiligo: Secondary | ICD-10-CM | POA: Diagnosis not present

## 2021-12-08 DIAGNOSIS — L80 Vitiligo: Secondary | ICD-10-CM | POA: Diagnosis not present

## 2021-12-12 DIAGNOSIS — L80 Vitiligo: Secondary | ICD-10-CM | POA: Diagnosis not present

## 2021-12-13 DIAGNOSIS — R143 Flatulence: Secondary | ICD-10-CM | POA: Diagnosis not present

## 2021-12-13 DIAGNOSIS — R140 Abdominal distension (gaseous): Secondary | ICD-10-CM | POA: Diagnosis not present

## 2021-12-13 DIAGNOSIS — Z86010 Personal history of colonic polyps: Secondary | ICD-10-CM | POA: Diagnosis not present

## 2021-12-13 DIAGNOSIS — K5900 Constipation, unspecified: Secondary | ICD-10-CM | POA: Diagnosis not present

## 2021-12-15 DIAGNOSIS — F331 Major depressive disorder, recurrent, moderate: Secondary | ICD-10-CM | POA: Diagnosis not present

## 2021-12-15 DIAGNOSIS — L80 Vitiligo: Secondary | ICD-10-CM | POA: Diagnosis not present

## 2021-12-22 DIAGNOSIS — F4329 Adjustment disorder with other symptoms: Secondary | ICD-10-CM | POA: Diagnosis not present

## 2021-12-22 DIAGNOSIS — Z5181 Encounter for therapeutic drug level monitoring: Secondary | ICD-10-CM | POA: Diagnosis not present

## 2021-12-22 DIAGNOSIS — D696 Thrombocytopenia, unspecified: Secondary | ICD-10-CM | POA: Diagnosis not present

## 2021-12-22 DIAGNOSIS — E039 Hypothyroidism, unspecified: Secondary | ICD-10-CM | POA: Diagnosis not present

## 2021-12-22 DIAGNOSIS — E559 Vitamin D deficiency, unspecified: Secondary | ICD-10-CM | POA: Diagnosis not present

## 2021-12-26 DIAGNOSIS — L80 Vitiligo: Secondary | ICD-10-CM | POA: Diagnosis not present

## 2021-12-29 DIAGNOSIS — L089 Local infection of the skin and subcutaneous tissue, unspecified: Secondary | ICD-10-CM | POA: Diagnosis not present

## 2021-12-29 DIAGNOSIS — L659 Nonscarring hair loss, unspecified: Secondary | ICD-10-CM | POA: Diagnosis not present

## 2021-12-29 DIAGNOSIS — L669 Cicatricial alopecia, unspecified: Secondary | ICD-10-CM | POA: Diagnosis not present

## 2021-12-31 DIAGNOSIS — H6121 Impacted cerumen, right ear: Secondary | ICD-10-CM | POA: Diagnosis not present

## 2021-12-31 DIAGNOSIS — R059 Cough, unspecified: Secondary | ICD-10-CM | POA: Diagnosis not present

## 2021-12-31 DIAGNOSIS — H66001 Acute suppurative otitis media without spontaneous rupture of ear drum, right ear: Secondary | ICD-10-CM | POA: Diagnosis not present

## 2021-12-31 DIAGNOSIS — R0981 Nasal congestion: Secondary | ICD-10-CM | POA: Diagnosis not present

## 2021-12-31 DIAGNOSIS — Z03818 Encounter for observation for suspected exposure to other biological agents ruled out: Secondary | ICD-10-CM | POA: Diagnosis not present

## 2021-12-31 DIAGNOSIS — K649 Unspecified hemorrhoids: Secondary | ICD-10-CM | POA: Diagnosis not present

## 2022-01-02 DIAGNOSIS — Z6836 Body mass index (BMI) 36.0-36.9, adult: Secondary | ICD-10-CM | POA: Diagnosis not present

## 2022-01-02 DIAGNOSIS — Z01419 Encounter for gynecological examination (general) (routine) without abnormal findings: Secondary | ICD-10-CM | POA: Diagnosis not present

## 2022-01-02 DIAGNOSIS — Z1231 Encounter for screening mammogram for malignant neoplasm of breast: Secondary | ICD-10-CM | POA: Diagnosis not present

## 2022-01-02 DIAGNOSIS — Z113 Encounter for screening for infections with a predominantly sexual mode of transmission: Secondary | ICD-10-CM | POA: Diagnosis not present

## 2022-01-02 DIAGNOSIS — Z130 Encounter for screening for diseases of the blood and blood-forming organs and certain disorders involving the immune mechanism: Secondary | ICD-10-CM | POA: Diagnosis not present

## 2022-01-02 DIAGNOSIS — R87810 Cervical high risk human papillomavirus (HPV) DNA test positive: Secondary | ICD-10-CM | POA: Diagnosis not present

## 2022-01-02 DIAGNOSIS — Z124 Encounter for screening for malignant neoplasm of cervix: Secondary | ICD-10-CM | POA: Diagnosis not present

## 2022-01-02 DIAGNOSIS — Z1151 Encounter for screening for human papillomavirus (HPV): Secondary | ICD-10-CM | POA: Diagnosis not present

## 2022-01-02 DIAGNOSIS — R140 Abdominal distension (gaseous): Secondary | ICD-10-CM | POA: Diagnosis not present

## 2022-01-02 DIAGNOSIS — L80 Vitiligo: Secondary | ICD-10-CM | POA: Diagnosis not present

## 2022-01-02 DIAGNOSIS — D259 Leiomyoma of uterus, unspecified: Secondary | ICD-10-CM | POA: Diagnosis not present

## 2022-01-08 DIAGNOSIS — L80 Vitiligo: Secondary | ICD-10-CM | POA: Diagnosis not present

## 2022-01-08 DIAGNOSIS — R635 Abnormal weight gain: Secondary | ICD-10-CM | POA: Diagnosis not present

## 2022-01-08 DIAGNOSIS — E669 Obesity, unspecified: Secondary | ICD-10-CM | POA: Diagnosis not present

## 2022-01-08 DIAGNOSIS — E890 Postprocedural hypothyroidism: Secondary | ICD-10-CM | POA: Diagnosis not present

## 2022-01-09 DIAGNOSIS — L80 Vitiligo: Secondary | ICD-10-CM | POA: Diagnosis not present

## 2022-01-12 DIAGNOSIS — F331 Major depressive disorder, recurrent, moderate: Secondary | ICD-10-CM | POA: Diagnosis not present

## 2022-01-12 DIAGNOSIS — L80 Vitiligo: Secondary | ICD-10-CM | POA: Diagnosis not present

## 2022-01-16 DIAGNOSIS — L80 Vitiligo: Secondary | ICD-10-CM | POA: Diagnosis not present

## 2022-01-19 DIAGNOSIS — L80 Vitiligo: Secondary | ICD-10-CM | POA: Diagnosis not present

## 2022-01-23 DIAGNOSIS — L80 Vitiligo: Secondary | ICD-10-CM | POA: Diagnosis not present

## 2022-01-24 DIAGNOSIS — R110 Nausea: Secondary | ICD-10-CM | POA: Diagnosis not present

## 2022-01-24 DIAGNOSIS — R140 Abdominal distension (gaseous): Secondary | ICD-10-CM | POA: Diagnosis not present

## 2022-01-25 DIAGNOSIS — G4733 Obstructive sleep apnea (adult) (pediatric): Secondary | ICD-10-CM | POA: Diagnosis not present

## 2022-01-25 DIAGNOSIS — R109 Unspecified abdominal pain: Secondary | ICD-10-CM | POA: Diagnosis not present

## 2022-01-25 DIAGNOSIS — R140 Abdominal distension (gaseous): Secondary | ICD-10-CM | POA: Diagnosis not present

## 2022-01-30 DIAGNOSIS — L80 Vitiligo: Secondary | ICD-10-CM | POA: Diagnosis not present

## 2022-09-13 ENCOUNTER — Telehealth: Payer: Self-pay

## 2022-09-13 NOTE — Telephone Encounter (Signed)
Patient called in stating her aunt, Traci Sermon told her Dr.Plotnikov gave the okay for April Elliott to become a patient. Okay to schedule?

## 2022-09-16 NOTE — Telephone Encounter (Signed)
Okay.  Thanks.

## 2022-09-17 NOTE — Telephone Encounter (Signed)
Patient is scheduled   

## 2022-10-01 ENCOUNTER — Ambulatory Visit: Payer: BC Managed Care – PPO | Admitting: Internal Medicine

## 2022-10-11 DIAGNOSIS — L668 Other cicatricial alopecia: Secondary | ICD-10-CM | POA: Diagnosis not present

## 2022-10-11 DIAGNOSIS — L80 Vitiligo: Secondary | ICD-10-CM | POA: Diagnosis not present

## 2022-10-12 DIAGNOSIS — E039 Hypothyroidism, unspecified: Secondary | ICD-10-CM | POA: Diagnosis not present

## 2022-10-12 DIAGNOSIS — R7303 Prediabetes: Secondary | ICD-10-CM | POA: Diagnosis not present

## 2022-10-12 DIAGNOSIS — Z23 Encounter for immunization: Secondary | ICD-10-CM | POA: Diagnosis not present

## 2022-10-12 DIAGNOSIS — F419 Anxiety disorder, unspecified: Secondary | ICD-10-CM | POA: Diagnosis not present

## 2022-10-15 DIAGNOSIS — N951 Menopausal and female climacteric states: Secondary | ICD-10-CM | POA: Diagnosis not present

## 2022-10-16 ENCOUNTER — Ambulatory Visit: Payer: BC Managed Care – PPO | Admitting: Internal Medicine

## 2022-10-22 DIAGNOSIS — L80 Vitiligo: Secondary | ICD-10-CM | POA: Diagnosis not present

## 2022-10-22 DIAGNOSIS — R635 Abnormal weight gain: Secondary | ICD-10-CM | POA: Diagnosis not present

## 2022-10-22 DIAGNOSIS — E890 Postprocedural hypothyroidism: Secondary | ICD-10-CM | POA: Diagnosis not present

## 2022-10-22 DIAGNOSIS — E669 Obesity, unspecified: Secondary | ICD-10-CM | POA: Diagnosis not present

## 2022-10-29 DIAGNOSIS — M5412 Radiculopathy, cervical region: Secondary | ICD-10-CM | POA: Diagnosis not present

## 2022-10-29 DIAGNOSIS — E890 Postprocedural hypothyroidism: Secondary | ICD-10-CM | POA: Diagnosis not present

## 2022-10-29 DIAGNOSIS — R7301 Impaired fasting glucose: Secondary | ICD-10-CM | POA: Diagnosis not present

## 2022-10-29 DIAGNOSIS — D696 Thrombocytopenia, unspecified: Secondary | ICD-10-CM | POA: Diagnosis not present

## 2022-11-05 DIAGNOSIS — G4733 Obstructive sleep apnea (adult) (pediatric): Secondary | ICD-10-CM | POA: Diagnosis not present

## 2022-11-08 DIAGNOSIS — F331 Major depressive disorder, recurrent, moderate: Secondary | ICD-10-CM | POA: Diagnosis not present

## 2022-11-15 DIAGNOSIS — M5412 Radiculopathy, cervical region: Secondary | ICD-10-CM | POA: Diagnosis not present

## 2022-11-29 DIAGNOSIS — F331 Major depressive disorder, recurrent, moderate: Secondary | ICD-10-CM | POA: Diagnosis not present

## 2022-11-30 ENCOUNTER — Ambulatory Visit: Payer: Federal, State, Local not specified - PPO | Admitting: Internal Medicine

## 2022-11-30 ENCOUNTER — Encounter: Payer: Self-pay | Admitting: Internal Medicine

## 2022-11-30 VITALS — BP 126/72 | HR 86 | Temp 98.2°F | Ht 65.0 in | Wt 213.2 lb

## 2022-11-30 DIAGNOSIS — R7303 Prediabetes: Secondary | ICD-10-CM | POA: Diagnosis not present

## 2022-11-30 DIAGNOSIS — Z0000 Encounter for general adult medical examination without abnormal findings: Secondary | ICD-10-CM

## 2022-11-30 DIAGNOSIS — M542 Cervicalgia: Secondary | ICD-10-CM

## 2022-11-30 DIAGNOSIS — M4802 Spinal stenosis, cervical region: Secondary | ICD-10-CM

## 2022-11-30 DIAGNOSIS — F419 Anxiety disorder, unspecified: Secondary | ICD-10-CM

## 2022-11-30 DIAGNOSIS — Z86010 Personal history of colonic polyps: Secondary | ICD-10-CM

## 2022-11-30 NOTE — Progress Notes (Signed)
Subjective:  Patient ID: April Elliott, female    DOB: [birth year]  Age: 56 y.o. 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     celecoxib (CELEBREX) 200 MG capsule take 1 capsule by mouth every day with food     citalopram (CELEXA) 40 MG tablet      clonazePAM (KLONOPIN) 0.5 MG tablet TAKE 1 TABLET BY MOUTH EVERY DAY FOR ANXIETY  1   Fexofenadine HCl (ALLEGRA PO) Allegra Allergy     fluticasone (FLONASE) 50 MCG/ACT nasal spray 1-2 SPRAY IN EACH NOSTRIL NASALLY ONCE A DAY 30 DAY(S)     gabapentin (NEURONTIN) 300 MG capsule gabapentin 300 mg capsule  1 CAPSULE EVERY MORNING , 1 AFTERNOON, 2 QHS, ORALLY 30 DAYS     liothyronine (CYTOMEL) 5 MCG tablet liothyronine 5 mcg tablet     melatonin 3 MG TABS tablet Take 2 tablets (6 mg total) by mouth at bedtime.  0   Multiple Vitamins-Minerals (MULTIVITAMIN GUMMIES ADULT) CHEW Chew 1 tablet by mouth daily.     Omega-3 Fatty Acids (FISH OIL) 1200 MG CAPS Take 1 capsule (1,200 mg total) by mouth daily.     Plecanatide (TRULANCE) 3 MG TABS Take 1 tablet by mouth daily. 30 tablet    SYNTHROID 100 MCG tablet TAKE 1 TABLET BY MOUTH EVERY MORNING ON AN EMPTY STOMACH  2   azithromycin (ZITHROMAX) 250 MG tablet Take by mouth.     benzonatate (TESSALON) 200 MG capsule Take 200 mg by mouth 3 (three) times daily.     celecoxib (CELEBREX) 50 MG capsule Take 1 capsule (50 mg total) by mouth 2 (two) times daily.     clonazePAM (KLONOPIN) 0.5 MG tablet      clotrimazole-betamethasone (LOTRISONE) cream Apply 1 application topically 2 (two) times daily. 45 g 2   conjugated estrogens (PREMARIN) vaginal cream Premarin  0.625 mg/gram vaginal cream     doxycycline (VIBRAMYCIN) 100 MG capsule doxycycline hyclate 100 mg capsule     fluconazole (DIFLUCAN) 150 MG tablet Take by mouth.     gabapentin (NEURONTIN) 300 MG capsule 1 CAPSULE EVERY MORNING , 1 AFTERNOON, 2 QHS, ORALLY 30 DAYS  11   metaxalone (SKELAXIN) 800 MG tablet Take 1 tablet (800 mg total) by mouth 3 (three) times daily.     montelukast (SINGULAIR) 10 MG tablet 1 tablet     prednisoLONE acetate (PRED FORTE) 1 % ophthalmic suspension prednisolone acetate 1 % eye drops,suspension  PLEASE SEE ATTACHED FOR DETAILED DIRECTIONS     terbinafine (LAMISIL) 250 MG tablet Take 1 tablet (250 mg total) by mouth daily. 30 tablet 0   triamcinolone cream (KENALOG) 0.1 % triamcinolone acetonide 0.1 % topical cream  APPLY TO THE AFFECTED AREAS ON HANDS 2 TIMES A DAY FOR 3 WEEKS THEN EVERY OTHER DAY. NOT FACE     No facility-administered medications prior to visit.    ROS: Review of Systems  Constitutional:  Negative for activity change, appetite change, chills, fatigue and unexpected weight change.  HENT:  Negative for congestion, mouth sores and sinus pressure.   Eyes:  Negative for visual disturbance.  Respiratory:  Negative for cough and chest tightness.   Gastrointestinal:  Negative for abdominal pain and nausea.  Genitourinary:  Negative for difficulty urinating, frequency and vaginal pain.  Musculoskeletal:  Negative for back pain and gait problem.  Skin:  Positive for color change. Negative for pallor and rash.  Neurological:  Negative for dizziness, tremors, weakness, numbness and headaches.  Psychiatric/Behavioral:  Negative for confusion and sleep disturbance.     Objective:  BP 126/72 (BP Location: Left Arm, Patient Position: Sitting, Cuff Size: Large)   Pulse 86   Temp 98.2 F (36.8 C) (Oral)   Ht 5\' 5"  (1.651 m)   Wt 213 lb 3.2 oz (96.7 kg)   LMP 05/18/2011   SpO2 97%   BMI 35.48 kg/m   BP Readings from Last 3 Encounters:  11/30/22  126/72  07/18/20 129/76    Wt Readings from Last 3 Encounters:  11/30/22 213 lb 3.2 oz (96.7 kg)  07/18/20 217 lb 4.8 oz (98.6 kg)    Physical Exam Constitutional:      General: She is not in acute distress.    Appearance: She is well-developed. She is obese.  HENT:     Head: Normocephalic.     Right Ear: External ear normal.     Left Ear: External ear normal.     Nose: Nose normal.  Eyes:     General:        Right eye: No discharge.        Left eye: No discharge.     Conjunctiva/sclera: Conjunctivae normal.     Pupils: Pupils are equal, round, and reactive to light.  Neck:     Thyroid: No thyromegaly.     Vascular: No JVD.     Trachea: No tracheal deviation.  Cardiovascular:     Rate and Rhythm: Normal rate and regular rhythm.     Heart sounds: Normal heart sounds.  Pulmonary:     Effort: No respiratory distress.     Breath sounds: No stridor. No wheezing.  Abdominal:     General: Bowel sounds are normal. There is no distension.     Palpations: Abdomen is soft. There is no mass.     Tenderness: There is no abdominal tenderness. There is no guarding or rebound.  Musculoskeletal:        General: No tenderness.     Cervical back: Normal range of motion and neck supple. No rigidity.  Lymphadenopathy:     Cervical: No cervical adenopathy.  Skin:    Findings: No erythema or rash.  Neurological:     Mental Status: She is oriented to person, place, and time.     Cranial Nerves: No cranial nerve deficit.     Motor: No abnormal muscle tone.     Coordination: Coordination normal.     Deep Tendon Reflexes: Reflexes normal.  Psychiatric:        Behavior: Behavior normal.        Thought Content: Thought content normal.        Judgment: Judgment normal.     Lab Results  Component Value Date   WBC 7.1 07/18/2020   HGB 13.8 07/18/2020   HCT 42.0 07/18/2020   PLT 133 (L) 07/18/2020    MM DIAG BREAST TOMO UNI RIGHT  Result Date: 01/23/2021 CLINICAL DATA:  Patient  returns after screening study for evaluation of possible RIGHT breast asymmetry. EXAM: DIGITAL DIAGNOSTIC UNILATERAL RIGHT MAMMOGRAM WITH TOMOSYNTHESIS AND CAD TECHNIQUE: Right digital diagnostic mammography and breast tomosynthesis was performed.  The images were evaluated with computer-aided detection. COMPARISON:  Previous exam(s). ACR Breast Density Category c: The breast tissue is heterogeneously dense, which may obscure small masses. FINDINGS: Additional 2-D and 3-D images are performed. These views show no persistent asymmetry in the LATERAL portion of the RIGHT breast. No suspicious mass, distortion, or microcalcifications are identified to suggest presence of malignancy. IMPRESSION: No mammographic evidence for malignancy. RECOMMENDATION: Screening mammogram in one year.(Code:SM-B-01Y) I have discussed the findings and recommendations with the patient. If applicable, a reminder letter will be sent to the patient regarding the next appointment. BI-RADS CATEGORY  1: Negative. Electronically Signed   By: Norva Pavlov M.D.   On: 01/23/2021 11:29    Assessment & Plan:   Problem List Items Addressed This Visit     Spinal stenosis in cervical region    On Gabapentin, pt had injections      Pre-diabetes - Primary    Dr Talmage Nap  Sch Cardiac CT calcium scoring test ordered      Relevant Orders   CT CARDIAC SCORING (SELF PAY ONLY)   TSH   Urinalysis   CBC with Differential/Platelet   Lipid panel   Comprehensive metabolic panel   Hemoglobin A1c   Personal history of colonic polyps    Dr Dulce Sellar Colon q 5 years Last colon 2023      Neck pain    On Gabapentin Ergonomic work station adviced      Anxiety    On Citalopram, clonazepam prn      Other Visit Diagnoses     Well adult exam       Relevant Orders   TSH   Urinalysis   CBC with Differential/Platelet   Lipid panel   Comprehensive metabolic panel         No orders of the defined types were placed in this  encounter.     Follow-up: Return in about 3 months (around 03/01/2023) for a follow-up visit.  Sonda Primes, MD

## 2022-11-30 NOTE — Assessment & Plan Note (Signed)
Dr Dulce Sellar Colon q 5 years Last colon 2023

## 2022-11-30 NOTE — Assessment & Plan Note (Signed)
Dr Talmage Nap  Sch Cardiac CT calcium scoring test ordered

## 2022-11-30 NOTE — Assessment & Plan Note (Signed)
On Gabapentin Ergonomic work station adviced

## 2022-11-30 NOTE — Assessment & Plan Note (Signed)
On Gabapentin, pt had injections

## 2022-11-30 NOTE — Assessment & Plan Note (Signed)
On Citalopram, clonazepam prn

## 2022-11-30 NOTE — Patient Instructions (Addendum)
Blue-Emu cream --use 2-3 times a day    Cardiac CT calcium scoring test $99    Computed tomography, more commonly known as a CT or CAT scan, is a diagnostic medical imaging test. Like traditional x-rays, it produces multiple images or pictures of the inside of the body. The cross-sectional images generated during a CT scan can be reformatted in multiple planes. They can even generate three-dimensional images. These images can be viewed on a computer monitor. CT images of internal organs, bones, soft tissue and blood vessels provide greater detail than traditional x-rays, particularly of soft tissues and blood vessels. A cardiac CT scan for coronary calcium is a non-invasive way of obtaining information about the presence, location and extent of calcified plaque in the coronary arteries--the vessels that supply oxygen-containing blood to the heart muscle. Calcified plaque results when there is a build-up of fat and other substances under the inner layer of the artery. This material can calcify which signals the presence of atherosclerosis, a disease of the vessel wall, also called coronary artery disease (CAD). People with this disease have an increased risk for heart attacks. In addition, over time, progression of plaque build up (CAD) can narrow the arteries or even close off blood flow to the heart. The result may be chest pain, sometimes called "angina," or a heart attack. Because calcium is a marker of CAD, the amount of calcium detected on a cardiac CT scan is a helpful prognostic tool. The findings on cardiac CT are expressed as a calcium score. Another name for this test is coronary artery calcium scoring.  What are some common uses of the procedure? The goal of cardiac CT scan for calcium scoring is to determine if CAD is present and to what extent, even if there are no symptoms. It is a screening study that may be recommended by a physician for patients with risk factors for CAD but no  clinical symptoms. The major risk factors for CAD are: high blood cholesterol levels  family history of heart attacks  diabetes  high blood pressure  cigarette smoking  overweight or obese  physical inactivity   A negative cardiac CT scan for calcium scoring shows no calcification within the coronary arteries. This suggests that CAD is absent or so minimal it cannot be seen by this technique. The chance of having a heart attack over the next two to five years is very low under these circumstances. A positive test means that CAD is present, regardless of whether or not the patient is experiencing any symptoms. The amount of calcification--expressed as the calcium score--may help to predict the likelihood of a myocardial infarction (heart attack) in the coming years and helps your medical doctor or cardiologist decide whether the patient may need to take preventive medicine or undertake other measures such as diet and exercise to lower the risk for heart attack. The extent of CAD is graded according to your calcium score:  Calcium Score  Presence of CAD (coronary artery disease)  0 No evidence of CAD   1-10 Minimal evidence of CAD  11-100 Mild evidence of CAD  101-400 Moderate evidence of CAD  Over 400 Extensive evidence of CAD   Coronary artery calcium (CAC) score is a strong predictor of incident coronary heart disease (CHD) and provides predictive information beyond traditional risk factors. CAC scoring is reasonable to use in the decision to withhold, postpone, or initiate statin therapy in intermediate-risk or selected borderline-risk asymptomatic adults (age 53-75 years and LDL-C >=70 to <  190 mg/dL) who do not have diabetes or established atherosclerotic cardiovascular disease (ASCVD).* In intermediate-risk (10-year ASCVD risk >=7.5% to <20%) adults or selected borderline-risk (10-year ASCVD risk >=5% to <7.5%) adults in whom a CAC score is measured for the purpose of making a  treatment decision the following recommendations have been made:   If CAC=0, it is reasonable to withhold statin therapy and reassess in 5 to 10 years, as long as higher risk conditions are absent (diabetes mellitus, family history of premature CHD in first degree relatives (males <55 years; females <65 years), cigarette smoking, or LDL >=190 mg/dL).   If CAC is 1 to 99, it is reasonable to initiate statin therapy for patients >=74 years of age.   If CAC is >=100 or >=75th percentile, it is reasonable to initiate statin therapy at any age.   Cardiology referral should be considered for patients with CAC scores >=400 or >=75th percentile.   *2018 AHA/ACC/AACVPR/AAPA/ABC/ACPM/ADA/AGS/APhA/ASPC/NLA/PCNA Guideline on the Management of Blood Cholesterol: A Report of the American College of Cardiology/American Heart Association Task Force on Clinical Practice Guidelines. J Am Coll Cardiol. 2019;73(24):3168-3209.

## 2022-12-06 DIAGNOSIS — G4733 Obstructive sleep apnea (adult) (pediatric): Secondary | ICD-10-CM | POA: Diagnosis not present

## 2022-12-17 DIAGNOSIS — M5412 Radiculopathy, cervical region: Secondary | ICD-10-CM | POA: Diagnosis not present

## 2022-12-21 DIAGNOSIS — M25512 Pain in left shoulder: Secondary | ICD-10-CM | POA: Diagnosis not present

## 2022-12-21 DIAGNOSIS — M5013 Cervical disc disorder with radiculopathy, cervicothoracic region: Secondary | ICD-10-CM | POA: Diagnosis not present

## 2022-12-21 DIAGNOSIS — M5412 Radiculopathy, cervical region: Secondary | ICD-10-CM | POA: Diagnosis not present

## 2022-12-25 DIAGNOSIS — M5412 Radiculopathy, cervical region: Secondary | ICD-10-CM | POA: Diagnosis not present

## 2023-01-03 DIAGNOSIS — M5013 Cervical disc disorder with radiculopathy, cervicothoracic region: Secondary | ICD-10-CM | POA: Diagnosis not present

## 2023-01-03 DIAGNOSIS — M5412 Radiculopathy, cervical region: Secondary | ICD-10-CM | POA: Diagnosis not present

## 2023-01-03 DIAGNOSIS — M25512 Pain in left shoulder: Secondary | ICD-10-CM | POA: Diagnosis not present

## 2023-01-06 DIAGNOSIS — G4733 Obstructive sleep apnea (adult) (pediatric): Secondary | ICD-10-CM | POA: Diagnosis not present

## 2023-01-08 DIAGNOSIS — M25512 Pain in left shoulder: Secondary | ICD-10-CM | POA: Diagnosis not present

## 2023-01-08 DIAGNOSIS — M5013 Cervical disc disorder with radiculopathy, cervicothoracic region: Secondary | ICD-10-CM | POA: Diagnosis not present

## 2023-01-08 DIAGNOSIS — M5412 Radiculopathy, cervical region: Secondary | ICD-10-CM | POA: Diagnosis not present

## 2023-01-11 DIAGNOSIS — M5013 Cervical disc disorder with radiculopathy, cervicothoracic region: Secondary | ICD-10-CM | POA: Diagnosis not present

## 2023-01-11 DIAGNOSIS — F331 Major depressive disorder, recurrent, moderate: Secondary | ICD-10-CM | POA: Diagnosis not present

## 2023-01-11 DIAGNOSIS — M5412 Radiculopathy, cervical region: Secondary | ICD-10-CM | POA: Diagnosis not present

## 2023-01-11 DIAGNOSIS — M25512 Pain in left shoulder: Secondary | ICD-10-CM | POA: Diagnosis not present

## 2023-01-14 DIAGNOSIS — M5412 Radiculopathy, cervical region: Secondary | ICD-10-CM | POA: Diagnosis not present

## 2023-01-14 DIAGNOSIS — M25512 Pain in left shoulder: Secondary | ICD-10-CM | POA: Diagnosis not present

## 2023-01-14 DIAGNOSIS — M5013 Cervical disc disorder with radiculopathy, cervicothoracic region: Secondary | ICD-10-CM | POA: Diagnosis not present

## 2023-01-17 DIAGNOSIS — L089 Local infection of the skin and subcutaneous tissue, unspecified: Secondary | ICD-10-CM | POA: Diagnosis not present

## 2023-01-17 DIAGNOSIS — L668 Other cicatricial alopecia: Secondary | ICD-10-CM | POA: Diagnosis not present

## 2023-01-17 DIAGNOSIS — L80 Vitiligo: Secondary | ICD-10-CM | POA: Diagnosis not present

## 2023-01-21 ENCOUNTER — Other Ambulatory Visit: Payer: BC Managed Care – PPO

## 2023-01-21 DIAGNOSIS — Z130 Encounter for screening for diseases of the blood and blood-forming organs and certain disorders involving the immune mechanism: Secondary | ICD-10-CM | POA: Diagnosis not present

## 2023-01-21 DIAGNOSIS — Z1231 Encounter for screening mammogram for malignant neoplasm of breast: Secondary | ICD-10-CM | POA: Diagnosis not present

## 2023-01-21 DIAGNOSIS — M5412 Radiculopathy, cervical region: Secondary | ICD-10-CM | POA: Diagnosis not present

## 2023-01-21 DIAGNOSIS — R87619 Unspecified abnormal cytological findings in specimens from cervix uteri: Secondary | ICD-10-CM | POA: Diagnosis not present

## 2023-01-21 DIAGNOSIS — Z1389 Encounter for screening for other disorder: Secondary | ICD-10-CM | POA: Diagnosis not present

## 2023-01-21 DIAGNOSIS — Z01419 Encounter for gynecological examination (general) (routine) without abnormal findings: Secondary | ICD-10-CM | POA: Diagnosis not present

## 2023-01-24 DIAGNOSIS — R7301 Impaired fasting glucose: Secondary | ICD-10-CM | POA: Diagnosis not present

## 2023-01-24 DIAGNOSIS — E669 Obesity, unspecified: Secondary | ICD-10-CM | POA: Diagnosis not present

## 2023-01-24 DIAGNOSIS — R635 Abnormal weight gain: Secondary | ICD-10-CM | POA: Diagnosis not present

## 2023-01-24 DIAGNOSIS — E890 Postprocedural hypothyroidism: Secondary | ICD-10-CM | POA: Diagnosis not present

## 2023-02-04 DIAGNOSIS — Z03818 Encounter for observation for suspected exposure to other biological agents ruled out: Secondary | ICD-10-CM | POA: Diagnosis not present

## 2023-02-04 DIAGNOSIS — B349 Viral infection, unspecified: Secondary | ICD-10-CM | POA: Diagnosis not present

## 2023-02-04 DIAGNOSIS — R051 Acute cough: Secondary | ICD-10-CM | POA: Diagnosis not present

## 2023-02-04 DIAGNOSIS — R5383 Other fatigue: Secondary | ICD-10-CM | POA: Diagnosis not present

## 2023-02-04 DIAGNOSIS — R0981 Nasal congestion: Secondary | ICD-10-CM | POA: Diagnosis not present

## 2023-02-19 ENCOUNTER — Other Ambulatory Visit: Payer: BC Managed Care – PPO

## 2023-02-20 ENCOUNTER — Other Ambulatory Visit: Payer: Self-pay | Admitting: Internal Medicine

## 2023-02-20 DIAGNOSIS — Z0000 Encounter for general adult medical examination without abnormal findings: Secondary | ICD-10-CM | POA: Diagnosis not present

## 2023-02-21 LAB — CBC/DIFF AMBIGUOUS DEFAULT
Basophils Absolute: 0 10*3/uL (ref 0.0–0.2)
Basos: 0 %
EOS (ABSOLUTE): 0.1 10*3/uL (ref 0.0–0.4)
Eos: 2 %
Hematocrit: 45 % (ref 34.0–46.6)
Hemoglobin: 14.9 g/dL (ref 11.1–15.9)
Immature Grans (Abs): 0 10*3/uL (ref 0.0–0.1)
Immature Granulocytes: 0 %
Lymphocytes Absolute: 1.5 10*3/uL (ref 0.7–3.1)
Lymphs: 29 %
MCH: 31.6 pg (ref 26.6–33.0)
MCHC: 33.1 g/dL (ref 31.5–35.7)
MCV: 96 fL (ref 79–97)
Monocytes Absolute: 0.4 10*3/uL (ref 0.1–0.9)
Monocytes: 8 %
Neutrophils Absolute: 3.2 10*3/uL (ref 1.4–7.0)
Neutrophils: 61 %
Platelets: 135 10*3/uL — ABNORMAL LOW (ref 150–450)
RBC: 4.71 x10E6/uL (ref 3.77–5.28)
RDW: 12.8 % (ref 11.7–15.4)
WBC: 5.2 10*3/uL (ref 3.4–10.8)

## 2023-02-21 LAB — COMPREHENSIVE METABOLIC PANEL
ALT: 11 IU/L (ref 0–32)
AST: 12 IU/L (ref 0–40)
Albumin/Globulin Ratio: 2.1 (ref 1.2–2.2)
Albumin: 4.2 g/dL (ref 3.8–4.9)
Alkaline Phosphatase: 84 IU/L (ref 44–121)
BUN/Creatinine Ratio: 15 (ref 9–23)
BUN: 14 mg/dL (ref 6–24)
Bilirubin Total: 0.4 mg/dL (ref 0.0–1.2)
CO2: 24 mmol/L (ref 20–29)
Calcium: 9.5 mg/dL (ref 8.7–10.2)
Chloride: 107 mmol/L — ABNORMAL HIGH (ref 96–106)
Creatinine, Ser: 0.93 mg/dL (ref 0.57–1.00)
Globulin, Total: 2 g/dL (ref 1.5–4.5)
Glucose: 90 mg/dL (ref 70–99)
Potassium: 4.5 mmol/L (ref 3.5–5.2)
Sodium: 143 mmol/L (ref 134–144)
Total Protein: 6.2 g/dL (ref 6.0–8.5)
eGFR: 71 mL/min/{1.73_m2} (ref >59)

## 2023-02-21 LAB — SPECIMEN STATUS REPORT

## 2023-02-21 LAB — URINALYSIS, DIPSTICK ONLY
Bilirubin, UA: NEGATIVE
Glucose, UA: NEGATIVE
Ketones, UA: NEGATIVE
Leukocytes,UA: NEGATIVE
Nitrite, UA: NEGATIVE
Protein,UA: NEGATIVE
RBC, UA: NEGATIVE
Specific Gravity, UA: 1.016 (ref 1.005–1.030)
Urobilinogen, Ur: 0.2 mg/dL (ref 0.2–1.0)
pH, UA: 7 (ref 5.0–7.5)

## 2023-02-21 LAB — LIPID PANEL W/O CHOL/HDL RATIO
Cholesterol, Total: 155 mg/dL (ref 100–199)
HDL: 60 mg/dL (ref >39)
LDL Chol Calc (NIH): 81 mg/dL (ref 0–99)
Triglycerides: 72 mg/dL (ref 0–149)
VLDL Cholesterol Cal: 14 mg/dL (ref 5–40)

## 2023-02-21 LAB — TSH: TSH: 0.723 u[IU]/mL (ref 0.450–4.500)

## 2023-02-28 ENCOUNTER — Encounter: Payer: Self-pay | Admitting: Internal Medicine

## 2023-02-28 ENCOUNTER — Ambulatory Visit (INDEPENDENT_AMBULATORY_CARE_PROVIDER_SITE_OTHER): Payer: Federal, State, Local not specified - PPO | Admitting: Internal Medicine

## 2023-02-28 VITALS — BP 118/80 | HR 85 | Temp 98.2°F | Ht 65.0 in | Wt 206.0 lb

## 2023-02-28 DIAGNOSIS — M542 Cervicalgia: Secondary | ICD-10-CM | POA: Diagnosis not present

## 2023-02-28 DIAGNOSIS — Z6834 Body mass index (BMI) 34.0-34.9, adult: Secondary | ICD-10-CM

## 2023-02-28 DIAGNOSIS — Z0000 Encounter for general adult medical examination without abnormal findings: Secondary | ICD-10-CM

## 2023-02-28 DIAGNOSIS — E038 Other specified hypothyroidism: Secondary | ICD-10-CM

## 2023-02-28 DIAGNOSIS — E669 Obesity, unspecified: Secondary | ICD-10-CM

## 2023-02-28 DIAGNOSIS — R7303 Prediabetes: Secondary | ICD-10-CM

## 2023-02-28 NOTE — Assessment & Plan Note (Signed)
Cont on Ozempic °

## 2023-02-28 NOTE — Assessment & Plan Note (Signed)
Dr Chalmers Cater On Synthroid, Cytomel

## 2023-02-28 NOTE — Assessment & Plan Note (Signed)
  We discussed age appropriate health related issues, including available/recomended screening tests and vaccinations. Labs were ordered to be later reviewed . All questions were answered. We discussed one or more of the following - seat belt use, use of sunscreen/sun exposure exercise, fall risk reduction, second hand smoke exposure, firearm use and storage, seat belt use, a need for adhering to healthy diet and exercise. Labs were ordered.  All questions were answered. Cardiac CT calcium scoring test $99 ordered On [facility] 02/2023

## 2023-02-28 NOTE — Assessment & Plan Note (Signed)
[facility] On Gabapentin Ergonomic work station advised On steroid shots

## 2023-02-28 NOTE — Progress Notes (Signed)
Subjective:  Patient ID: April Elliott, female    DOB: 05/09/1966  Age: 57 y.o. MRN: [id number]  CC: Annual Exam (physical)   HPI April Elliott presents for a well exam  Outpatient Medications Prior to Visit  Medication Sig Dispense Refill   Bacillus Coagulans-Inulin (PROBIOTIC) 1-250 BILLION-MG CAPS Take 1 capsule by mouth daily.     celecoxib (CELEBREX) 200 MG capsule take 1 capsule by mouth every day with food     Cholecalciferol 50 MCG (2000 UT) CAPS 1 tablet Orally Once a day     citalopram (CELEXA) 40 MG tablet      clonazePAM (KLONOPIN) 0.5 MG tablet TAKE 1 TABLET BY MOUTH EVERY DAY FOR ANXIETY  1   COMBIPATCH 0.05-0.14 MG/DAY Place 1 patch onto the skin 2 (two) times a week.     cyclobenzaprine (FLEXERIL) 10 MG tablet Take 10 mg by mouth at bedtime.     dextromethorphan-guaiFENesin (MUCINEX DM) 30-600 MG 12hr tablet 1 tablet as needed Orally every 12 hrs for 10 days     Ferrous Sulfate (IRON PO) Take by mouth.     Fluocinolone Acetonide Scalp 0.01 % OIL Apply to the scalp three times weekly     fluticasone (FLONASE) 50 MCG/ACT nasal spray 1-2 SPRAY IN EACH NOSTRIL NASALLY ONCE A DAY 30 DAY(S)     gabapentin (NEURONTIN) 300 MG capsule gabapentin 300 mg capsule  1 CAPSULE EVERY MORNING , 1 AFTERNOON, 2 QHS, ORALLY 30 DAYS     ipratropium (ATROVENT) 0.06 % nasal spray 2 sprays in each nostril Nasally four times a day for 30 days     Krill Oil (OMEGA-3) 500 MG CAPS Take by mouth.     liothyronine (CYTOMEL) 5 MCG tablet liothyronine 5 mcg tablet     loratadine (CLARITIN) 10 MG tablet Take by mouth.     Magnesium 300 MG CAPS 1 capsule with a meal Orally Once a day     melatonin 3 MG TABS tablet Take 2 tablets (6 mg total) by mouth at bedtime.  0   microfibrillar collagen (AVITENE FLOUR) powder Apply topically.     minoxidil (LONITEN) 2.5 MG tablet Take 0.5 tablets by mouth daily.     Multiple Vitamins-Minerals (MULTIVITAMIN GUMMIES ADULT) CHEW Chew 1 tablet by mouth daily.      Oyster Shell Calcium 500 MG TABS 1 tablet with meals Orally Twice a day     Plecanatide (TRULANCE) 3 MG TABS Take 1 tablet by mouth daily. 30 tablet    Ruxolitinib Phosphate (OPZELURA) 1.5 % CREA Apply topically.     SYNTHROID 100 MCG tablet TAKE 1 TABLET BY MOUTH EVERY MORNING ON AN EMPTY STOMACH  2   WEGOVY 0.25 MG/0.5ML SOAJ Inject into the skin.     Omega-3 Fatty Acids (FISH OIL) 1200 MG CAPS Take 1 capsule (1,200 mg total) by mouth daily.     Fexofenadine HCl (ALLEGRA PO) Allegra Allergy     No facility-administered medications prior to visit.    ROS: Review of Systems  Constitutional:  Negative for activity change, appetite change, chills, fatigue and unexpected weight change.  HENT:  Negative for congestion, mouth sores and sinus pressure.   Eyes:  Negative for visual disturbance.  Respiratory:  Negative for cough and chest tightness.   Gastrointestinal:  Negative for abdominal pain and nausea.  Genitourinary:  Negative for difficulty urinating, frequency and vaginal pain.  Musculoskeletal:  Negative for back pain and gait problem.  Skin:  Negative for pallor and rash.  Neurological:  Negative for dizziness, tremors, weakness, numbness and headaches.  Psychiatric/Behavioral:  Negative for confusion and sleep disturbance.     Objective:  BP 118/80 (BP Location: Left Arm, Patient Position: Sitting, Cuff Size: Large)   Pulse 85   Temp 98.2 F (36.8 C) (Oral)   Ht 5\' 5"  (1.651 m)   Wt 206 lb (93.4 kg)   LMP 05/18/2011   SpO2 94%   BMI 34.28 kg/m   BP Readings from Last 3 Encounters:  02/28/23 118/80  11/30/22 126/72  07/18/20 129/76    Wt Readings from Last 3 Encounters:  02/28/23 206 lb (93.4 kg)  11/30/22 213 lb 3.2 oz (96.7 kg)  07/18/20 217 lb 4.8 oz (98.6 kg)    Physical Exam Constitutional:      General: She is not in acute distress.    Appearance: She is well-developed. She is obese.  HENT:     Head: Normocephalic.     Right Ear: External ear  normal.     Left Ear: External ear normal.     Nose: Nose normal.  Eyes:     General:        Right eye: No discharge.        Left eye: No discharge.     Conjunctiva/sclera: Conjunctivae normal.     Pupils: Pupils are equal, round, and reactive to light.  Neck:     Thyroid: No thyromegaly.     Vascular: No JVD.     Trachea: No tracheal deviation.  Cardiovascular:     Rate and Rhythm: Normal rate and regular rhythm.     Heart sounds: Normal heart sounds.  Pulmonary:     Effort: No respiratory distress.     Breath sounds: No stridor. No wheezing.  Abdominal:     General: Bowel sounds are normal. There is no distension.     Palpations: Abdomen is soft. There is no mass.     Tenderness: There is no abdominal tenderness. There is no guarding or rebound.  Musculoskeletal:        General: No tenderness.     Cervical back: Normal range of motion and neck supple. No rigidity.  Lymphadenopathy:     Cervical: No cervical adenopathy.  Skin:    Findings: No erythema or rash.  Neurological:     Cranial Nerves: No cranial nerve deficit.     Motor: No abnormal muscle tone.     Coordination: Coordination normal.     Deep Tendon Reflexes: Reflexes normal.  Psychiatric:        Behavior: Behavior normal.        Thought Content: Thought content normal.        Judgment: Judgment normal.     Lab Results  Component Value Date   WBC 5.2 02/20/2023   HGB 14.9 02/20/2023   HCT 45.0 02/20/2023   PLT 135 (L) 02/20/2023   GLUCOSE 90 02/20/2023   CHOL 155 02/20/2023   TRIG 72 02/20/2023   HDL 60 02/20/2023   LDLCALC 81 02/20/2023   ALT 11 02/20/2023   AST 12 02/20/2023   NA 143 02/20/2023   K 4.5 02/20/2023   CL 107 (H) 02/20/2023   CREATININE 0.93 02/20/2023   BUN 14 02/20/2023   CO2 24 02/20/2023   TSH 0.723 02/20/2023    MM DIAG BREAST TOMO UNI RIGHT  Result Date: 01/23/2021 CLINICAL DATA:  Patient returns after screening study for evaluation of possible RIGHT breast asymmetry.  EXAM: DIGITAL DIAGNOSTIC UNILATERAL RIGHT MAMMOGRAM  WITH TOMOSYNTHESIS AND CAD TECHNIQUE: Right digital diagnostic mammography and breast tomosynthesis was performed. The images were evaluated with computer-aided detection. COMPARISON:  Previous exam(s). ACR Breast Density Category c: The breast tissue is heterogeneously dense, which may obscure small masses. FINDINGS: Additional 2-D and 3-D images are performed. These views show no persistent asymmetry in the LATERAL portion of the RIGHT breast. No suspicious mass, distortion, or microcalcifications are identified to suggest presence of malignancy. IMPRESSION: No mammographic evidence for malignancy. RECOMMENDATION: Screening mammogram in one year.(Code:SM-B-01Y) I have discussed the findings and recommendations with the patient. If applicable, a reminder letter will be sent to the patient regarding the next appointment. BI-RADS CATEGORY  1: Negative. Electronically Signed   By: Nolon Nations M.D.   On: 01/23/2021 11:29    Assessment & Plan:   Problem List Items Addressed This Visit       Endocrine   Hypothyroidism    Dr Chalmers Cater On Synthroid, Cytomel        Other   Neck pain    [facility] On Gabapentin Ergonomic work station advised On steroid shots      Obesity - Primary    Cont on Ozempic      Relevant Medications   WEGOVY 0.25 MG/0.5ML SOAJ   Pre-diabetes     Sch Cardiac CT calcium scoring test $99 pending On [facility] 02/2023      Well adult exam     We discussed age appropriate health related issues, including available/recomended screening tests and vaccinations. Labs were ordered to be later reviewed . All questions were answered. We discussed one or more of the following - seat belt use, use of sunscreen/sun exposure exercise, fall risk reduction, second hand smoke exposure, firearm use and storage, seat belt use, a need for adhering to healthy diet and exercise. Labs were ordered.  All questions were  answered. Cardiac CT calcium scoring test $99 ordered On [facility] 02/2023         No orders of the defined types were placed in this encounter.     Follow-up: Return in about 6 months (around 08/31/2023) for a follow-up visit.  Walker Kehr, MD

## 2023-02-28 NOTE — Assessment & Plan Note (Signed)
Sch Cardiac CT calcium scoring test $99 pending On [facility] 02/2023

## 2023-03-05 DIAGNOSIS — M5412 Radiculopathy, cervical region: Secondary | ICD-10-CM | POA: Diagnosis not present

## 2023-03-06 DIAGNOSIS — E039 Hypothyroidism, unspecified: Secondary | ICD-10-CM | POA: Diagnosis not present

## 2023-03-06 DIAGNOSIS — G4733 Obstructive sleep apnea (adult) (pediatric): Secondary | ICD-10-CM | POA: Diagnosis not present

## 2023-03-19 DIAGNOSIS — G4733 Obstructive sleep apnea (adult) (pediatric): Secondary | ICD-10-CM | POA: Diagnosis not present

## 2023-03-28 ENCOUNTER — Other Ambulatory Visit: Payer: BC Managed Care – PPO

## 2023-04-08 DIAGNOSIS — M5412 Radiculopathy, cervical region: Secondary | ICD-10-CM | POA: Diagnosis not present

## 2023-05-03 ENCOUNTER — Ambulatory Visit
Admission: RE | Admit: 2023-05-03 | Discharge: 2023-05-03 | Disposition: A | Discharge status: Home / Self Care | Payer: BC Managed Care – PPO | Source: Ambulatory Visit | Attending: Internal Medicine | Admitting: Internal Medicine

## 2023-05-03 DIAGNOSIS — R7303 Prediabetes: Secondary | ICD-10-CM

## 2023-06-11 ENCOUNTER — Encounter: Payer: Self-pay | Admitting: Internal Medicine

## 2023-06-11 ENCOUNTER — Other Ambulatory Visit: Payer: Self-pay | Admitting: Internal Medicine

## 2023-06-11 MED ORDER — LIOTHYRONINE SODIUM 5 MCG PO TABS: 5.0000 ug | ORAL_TABLET | Freq: Every day | ORAL | 0 refills | Status: AC

## 2023-06-11 MED ORDER — CITALOPRAM HYDROBROMIDE 40 MG PO TABS: 40.0000 mg | ORAL_TABLET | Freq: Every day | ORAL | 0 refills | Status: DC

## 2023-06-11 MED ORDER — SYNTHROID 100 MCG PO TABS: 100.0000 ug | ORAL_TABLET | Freq: Every morning | ORAL | 0 refills | Status: DC

## 2023-06-11 MED ORDER — CELECOXIB 200 MG PO CAPS: 200.0000 mg | ORAL_CAPSULE | Freq: Every day | ORAL | 0 refills | Status: DC

## 2023-06-12 ENCOUNTER — Encounter: Payer: Self-pay | Admitting: Internal Medicine

## 2023-06-12 MED ORDER — CLONAZEPAM 0.5 MG PO TABS: ORAL_TABLET | ORAL | 1 refills | Status: DC

## 2023-06-17 ENCOUNTER — Encounter: Payer: Self-pay | Admitting: Internal Medicine

## 2023-06-26 DIAGNOSIS — E039 Hypothyroidism, unspecified: Secondary | ICD-10-CM | POA: Diagnosis not present

## 2023-06-26 DIAGNOSIS — G4733 Obstructive sleep apnea (adult) (pediatric): Secondary | ICD-10-CM | POA: Diagnosis not present

## 2023-07-04 DIAGNOSIS — G4733 Obstructive sleep apnea (adult) (pediatric): Secondary | ICD-10-CM | POA: Diagnosis not present

## 2023-07-17 ENCOUNTER — Encounter: Payer: Self-pay | Admitting: Internal Medicine

## 2023-07-17 ENCOUNTER — Ambulatory Visit: Payer: Federal, State, Local not specified - PPO | Admitting: Internal Medicine

## 2023-07-17 VITALS — BP 112/70 | HR 88 | Temp 98.0°F | Ht 65.0 in | Wt 203.0 lb

## 2023-07-17 DIAGNOSIS — F419 Anxiety disorder, unspecified: Secondary | ICD-10-CM | POA: Diagnosis not present

## 2023-07-17 DIAGNOSIS — G4733 Obstructive sleep apnea (adult) (pediatric): Secondary | ICD-10-CM

## 2023-07-17 DIAGNOSIS — R7303 Prediabetes: Secondary | ICD-10-CM | POA: Diagnosis not present

## 2023-07-17 DIAGNOSIS — E669 Obesity, unspecified: Secondary | ICD-10-CM | POA: Diagnosis not present

## 2023-07-17 DIAGNOSIS — E038 Other specified hypothyroidism: Secondary | ICD-10-CM

## 2023-07-17 DIAGNOSIS — Z6834 Body mass index (BMI) 34.0-34.9, adult: Secondary | ICD-10-CM | POA: Diagnosis not present

## 2023-07-17 DIAGNOSIS — I2510 Atherosclerotic heart disease of native coronary artery without angina pectoris: Secondary | ICD-10-CM

## 2023-07-17 DIAGNOSIS — I2583 Coronary atherosclerosis due to lipid rich plaque: Secondary | ICD-10-CM

## 2023-07-17 MED ORDER — LEVOTHYROXINE SODIUM 88 MCG PO TABS: 88.0000 ug | ORAL_TABLET | Freq: Every day | ORAL | 3 refills | Status: AC

## 2023-07-17 NOTE — Progress Notes (Signed)
Subjective:  Patient ID: April Elliott, female    DOB: 1966-09-21  Age: 57 y.o. MRN: [id number]  CC: Results (Discuss CT results)   HPI Kaneesha Beddoe presents for coronary atherosclerosis, anxiety, obesity  Outpatient Medications Prior to Visit  Medication Sig Dispense Refill   Bacillus Coagulans-Inulin (PROBIOTIC) 1-250 BILLION-MG CAPS Take 1 capsule by mouth daily.     celecoxib (CELEBREX) 200 MG capsule Take 1 capsule (200 mg total) by mouth daily. Per MD Return in about 6 months (around 08/31/2023) for a follow-up visit. 90 capsule 0   Cholecalciferol 50 MCG (2000 UT) CAPS 1 tablet Orally Once a day     citalopram (CELEXA) 40 MG tablet Take 1 tablet (40 mg total) by mouth daily. Per MD Return in about 6 months (around 08/31/2023) for a follow-up visit. 90 tablet 0   clonazePAM (KLONOPIN) 0.5 MG tablet TAKE 1 TABLET BY MOUTH EVERY DAY FOR ANXIETY 30 tablet 1   COMBIPATCH 0.05-0.14 MG/DAY Place 1 patch onto the skin 2 (two) times a week.     cyclobenzaprine (FLEXERIL) 10 MG tablet Take 10 mg by mouth at bedtime.     Ferrous Sulfate (IRON PO) Take by mouth.     Fezolinetant (VEOZAH) 45 MG TABS      Fluocinolone Acetonide Scalp 0.01 % OIL Apply to the scalp three times weekly     fluticasone (FLONASE) 50 MCG/ACT nasal spray 1-2 SPRAY IN EACH NOSTRIL NASALLY ONCE A DAY 30 DAY(S)     gabapentin (NEURONTIN) 300 MG capsule gabapentin 300 mg capsule  1 CAPSULE EVERY MORNING , 1 AFTERNOON, 2 QHS, ORALLY 30 DAYS     Krill Oil (OMEGA-3) 500 MG CAPS Take by mouth.     liothyronine (CYTOMEL) 5 MCG tablet Take 1 tablet (5 mcg total) by mouth daily. Per MD Return in about 6 months (around 08/31/2023) for a follow-up visit. 90 tablet 0   loratadine (CLARITIN) 10 MG tablet Take by mouth.     Magnesium 300 MG CAPS 1 capsule with a meal Orally Once a day     melatonin 3 MG TABS tablet Take 2 tablets (6 mg total) by mouth at bedtime.  0   methocarbamol (ROBAXIN) 500 MG tablet Take 500-1,000 mg by  mouth every 6 (six) hours as needed.     microfibrillar collagen (AVITENE FLOUR) powder Apply topically.     minoxidil (LONITEN) 2.5 MG tablet Take 0.5 tablets by mouth daily.     Multiple Vitamins-Minerals (MULTIVITAMIN GUMMIES ADULT) CHEW Chew 1 tablet by mouth daily.     Oyster Shell Calcium 500 MG TABS 1 tablet with meals Orally Twice a day     Plecanatide (TRULANCE) 3 MG TABS Take 1 tablet by mouth daily. 30 tablet    Ruxolitinib Phosphate (OPZELURA) 1.5 % CREA Apply topically.     WEGOVY 0.25 MG/0.5ML SOAJ Inject into the skin.     SYNTHROID 100 MCG tablet Take 1 tablet (100 mcg total) by mouth every morning. on an empty stomach Per MD Return in about 6 months (around 08/31/2023) for a follow-up visit. 90 tablet 0   dextromethorphan-guaiFENesin (MUCINEX DM) 30-600 MG 12hr tablet 1 tablet as needed Orally every 12 hrs for 10 days     ipratropium (ATROVENT) 0.06 % nasal spray 2 sprays in each nostril Nasally four times a day for 30 days     No facility-administered medications prior to visit.    ROS: Review of Systems  Constitutional:  Negative for activity change, appetite change,  chills, fatigue and unexpected weight change.  HENT:  Negative for congestion, mouth sores and sinus pressure.   Eyes:  Negative for visual disturbance.  Respiratory:  Negative for cough and chest tightness.   Gastrointestinal:  Negative for abdominal pain and nausea.  Genitourinary:  Negative for difficulty urinating, frequency and vaginal pain.  Musculoskeletal:  Negative for back pain and gait problem.  Skin:  Negative for pallor and rash.  Neurological:  Negative for dizziness, tremors, weakness, numbness and headaches.  Psychiatric/Behavioral:  Negative for confusion and sleep disturbance.     Objective:  BP 112/70 (BP Location: Right Arm, Patient Position: Sitting, Cuff Size: Large)   Pulse 88   Temp 98 F (36.7 C) (Oral)   Ht 5\' 5"  (1.651 m)   Wt 203 lb (92.1 kg)   LMP 05/18/2011   SpO2 95%    BMI 33.78 kg/m   BP Readings from Last 3 Encounters:  07/17/23 112/70  02/28/23 118/80  11/30/22 126/72    Wt Readings from Last 3 Encounters:  07/17/23 203 lb (92.1 kg)  02/28/23 206 lb (93.4 kg)  11/30/22 213 lb 3.2 oz (96.7 kg)    Physical Exam Constitutional:      General: She is not in acute distress.    Appearance: Normal appearance. She is well-developed.  HENT:     Head: Normocephalic.     Right Ear: External ear normal.     Left Ear: External ear normal.     Nose: Nose normal.  Eyes:     General:        Right eye: No discharge.        Left eye: No discharge.     Conjunctiva/sclera: Conjunctivae normal.     Pupils: Pupils are equal, round, and reactive to light.  Neck:     Thyroid: No thyromegaly.     Vascular: No JVD.     Trachea: No tracheal deviation.  Cardiovascular:     Rate and Rhythm: Normal rate and regular rhythm.     Heart sounds: Normal heart sounds.  Pulmonary:     Effort: No respiratory distress.     Breath sounds: No stridor. No wheezing.  Abdominal:     General: Bowel sounds are normal. There is no distension.     Palpations: Abdomen is soft. There is no mass.     Tenderness: There is no abdominal tenderness. There is no guarding or rebound.  Musculoskeletal:        General: No tenderness.     Cervical back: Normal range of motion and neck supple. No rigidity.  Lymphadenopathy:     Cervical: No cervical adenopathy.  Skin:    Findings: No erythema or rash.  Neurological:     Cranial Nerves: No cranial nerve deficit.     Motor: No abnormal muscle tone.     Coordination: Coordination normal.     Deep Tendon Reflexes: Reflexes normal.  Psychiatric:        Behavior: Behavior normal.        Thought Content: Thought content normal.        Judgment: Judgment normal.     Lab Results  Component Value Date   WBC 5.2 02/20/2023   HGB 14.9 02/20/2023   HCT 45.0 02/20/2023   PLT 135 (L) 02/20/2023   GLUCOSE 90 02/20/2023   CHOL 155  02/20/2023   TRIG 72 02/20/2023   HDL 60 02/20/2023   LDLCALC 81 02/20/2023   ALT 11 02/20/2023   AST 12  02/20/2023   NA 143 02/20/2023   K 4.5 02/20/2023   CL 107 (H) 02/20/2023   CREATININE 0.93 02/20/2023   BUN 14 02/20/2023   CO2 24 02/20/2023   TSH 0.723 02/20/2023    CT CARDIAC SCORING ([facility] LOCATIONS ONLY)  Result Date: 05/03/2023 CLINICAL DATA:  57 year old African American female with history of dyslipidemia and family history of coronary artery disease. * Tracking Code: FCC * EXAM: CT CARDIAC CORONARY ARTERY CALCIUM SCORE TECHNIQUE: Non-contrast imaging through the heart was performed using prospective ECG gating. Image post processing was performed on an independent workstation, allowing for quantitative analysis of the heart and coronary arteries. Note that this exam targets the heart and the chest was not imaged in its entirety. COMPARISON:  No priors. FINDINGS: CORONARY CALCIUM SCORES: Left Main: 0 LAD: 16 LCx: 0 RCA: 0 Total Agatston Score: 16 MESA database percentile: 83rd AORTA MEASUREMENTS: Ascending Aorta: 3.2 cm Descending Aorta:2.4 cm OTHER FINDINGS: Within the visualized portions of the thorax there are no suspicious appearing pulmonary nodules or masses, there is no acute consolidative airspace disease, no pleural effusions, no pneumothorax and no lymphadenopathy. Visualized portions of the upper abdomen are unremarkable. There are no aggressive appearing lytic or blastic lesions noted in the visualized portions of the skeleton. IMPRESSION: 1. Patient's total coronary artery calcium score is 16 which is 83rd percentile for patient's of matched age, gender and race/ethnicity. Please note that although the presence of coronary artery calcium documents the presence of coronary artery disease, the severity of this disease and any potential stenosis cannot be assessed on this noncontrast CT examination. Assessment for potential risk factor modification, dietary therapy or  pharmacologic therapy may be warranted, if clinically indicated. 2. No significant incidental noncardiac findings are noted. Electronically Signed   By: Trudie Reed M.D.   On: 05/03/2023 10:47    Assessment & Plan:   Problem List Items Addressed This Visit     Anxiety    On Citalopram, clonazepam prn      Hypothyroidism   Relevant Medications   levothyroxine (SYNTHROID) 88 MCG tablet   Other Relevant Orders   TSH   T4, free   Obesity    On Wegovy now      Relevant Orders   TSH   T4, free   OSA on CPAP   Pre-diabetes    On Wegovy      Relevant Orders   Hemoglobin A1c   Coronary atherosclerosis - Primary    04/2023 Coronary calcium CT score is 16 - a minimal to mild degree of coronary atherosclerosis. Current management with Wegovy and Krill oil  Not interested in statins      Relevant Orders   Comprehensive metabolic panel      Meds ordered this encounter  Medications   levothyroxine (SYNTHROID) 88 MCG tablet    Sig: Take 1 tablet (88 mcg total) by mouth daily.    Dispense:  90 tablet    Refill:  3    Brand only      Follow-up: Return in about 6 months (around 01/17/2024) for Wellness Exam.  Sonda Primes, MD

## 2023-07-17 NOTE — Assessment & Plan Note (Signed)
On Wegovy ?

## 2023-07-17 NOTE — Patient Instructions (Signed)
  Your coronary calcium CT score is 16.  It is consistent with a minimal to mild degree of coronary atherosclerosis.  Your current management with Wegovy and Boris Lown oil is appropriate.  Your other chest organs look normal.  Sincerely, AP  CAD stands for Coronary Artery Disease  Calcium Score  Presence of CAD  0                      No evidence of CAD  1-10                      Minimal evidence of CAD 11-100                      Mild evidence of CAD 101-400          Moderate evidence of CAD Over 400          Extensive evidence of CAD   Coronary artery calcium (CAC) score is a strong predictor of incident coronary heart disease (CHD) and provides predictive information beyond traditional risk factors. CAC scoring is reasonable to use in the decision to withhold, postpone, or initiate statin therapy in intermediate-risk or selected borderline-risk asymptomatic adults (age 30-75 years and LDL-C >=70 to <190 mg/dL) who do not have diabetes or established atherosclerotic cardiovascular disease (ASCVD).* In intermediate-risk (10-year ASCVD risk >=7.5% to <20%) adults or selected borderline-risk (10-year ASCVD risk >=5% to <7.5%) adults in whom a CAC score is measured for the purpose of making a treatment decision the following recommendations have been made:   If CAC=0, it is reasonable to withhold statin therapy and reassess in 5 to 10 years, as long as higher risk conditions are absent (diabetes mellitus, family history of premature CHD in first degree relatives (males <55 years; females <65 years), cigarette smoking, or LDL >=190 mg/dL).   If CAC is 1 to 99, it is reasonable to initiate statin therapy for patients >=46 years of age.   If CAC is >=100 or >=75th percentile, it is reasonable to initiate statin therapy at any age.   Cardiology referral should be considered for patients with CAC scores >=400 or >=75th percentile.   *2018  AHA/ACC/AACVPR/AAPA/ABC/ACPM/ADA/AGS/APhA/ASPC/NLA/PCNA Guideline on the Management of Blood Cholesterol: A Report of the American College of Cardiology/American Heart Association Task Force on Clinical Practice Guidelines. J Am Coll Cardiol. 2019;73(24):3168-3209.

## 2023-07-17 NOTE — Assessment & Plan Note (Signed)
On Wegovy now

## 2023-07-17 NOTE — Assessment & Plan Note (Signed)
04/2023 Coronary calcium CT score is 16 - a minimal to mild degree of coronary atherosclerosis. Current management with Wegovy and Krill oil  Not interested in statins

## 2023-07-17 NOTE — Assessment & Plan Note (Signed)
On Citalopram, clonazepam prn

## 2023-07-18 ENCOUNTER — Encounter (INDEPENDENT_AMBULATORY_CARE_PROVIDER_SITE_OTHER): Payer: Self-pay

## 2023-07-19 DIAGNOSIS — H524 Presbyopia: Secondary | ICD-10-CM | POA: Diagnosis not present

## 2023-07-19 DIAGNOSIS — H43393 Other vitreous opacities, bilateral: Secondary | ICD-10-CM | POA: Diagnosis not present

## 2023-07-19 DIAGNOSIS — H02881 Meibomian gland dysfunction right upper eyelid: Secondary | ICD-10-CM | POA: Diagnosis not present

## 2023-07-22 ENCOUNTER — Encounter: Payer: Self-pay | Admitting: Internal Medicine

## 2023-07-26 DIAGNOSIS — R7301 Impaired fasting glucose: Secondary | ICD-10-CM | POA: Diagnosis not present

## 2023-07-26 DIAGNOSIS — E890 Postprocedural hypothyroidism: Secondary | ICD-10-CM | POA: Diagnosis not present

## 2023-07-26 DIAGNOSIS — R635 Abnormal weight gain: Secondary | ICD-10-CM | POA: Diagnosis not present

## 2023-08-06 DIAGNOSIS — M5412 Radiculopathy, cervical region: Secondary | ICD-10-CM | POA: Diagnosis not present

## 2023-08-19 DIAGNOSIS — R635 Abnormal weight gain: Secondary | ICD-10-CM | POA: Diagnosis not present

## 2023-08-19 DIAGNOSIS — L80 Vitiligo: Secondary | ICD-10-CM | POA: Diagnosis not present

## 2023-08-19 DIAGNOSIS — E890 Postprocedural hypothyroidism: Secondary | ICD-10-CM | POA: Diagnosis not present

## 2023-08-19 DIAGNOSIS — E669 Obesity, unspecified: Secondary | ICD-10-CM | POA: Diagnosis not present

## 2023-08-20 DIAGNOSIS — L089 Local infection of the skin and subcutaneous tissue, unspecified: Secondary | ICD-10-CM | POA: Diagnosis not present

## 2023-08-20 DIAGNOSIS — L80 Vitiligo: Secondary | ICD-10-CM | POA: Diagnosis not present

## 2023-08-20 DIAGNOSIS — L668 Other cicatricial alopecia: Secondary | ICD-10-CM | POA: Diagnosis not present

## 2023-08-27 ENCOUNTER — Other Ambulatory Visit: Payer: Self-pay | Admitting: Internal Medicine

## 2023-08-28 MED ORDER — CELECOXIB 200 MG PO CAPS: 200.0000 mg | ORAL_CAPSULE | Freq: Every day | ORAL | 0 refills | Status: DC

## 2023-08-29 ENCOUNTER — Ambulatory Visit: Payer: Federal, State, Local not specified - PPO | Admitting: Internal Medicine

## 2023-09-06 ENCOUNTER — Other Ambulatory Visit: Payer: Self-pay | Admitting: Internal Medicine

## 2023-09-06 DIAGNOSIS — Z1211 Encounter for screening for malignant neoplasm of colon: Secondary | ICD-10-CM

## 2023-09-06 DIAGNOSIS — Z1212 Encounter for screening for malignant neoplasm of rectum: Secondary | ICD-10-CM

## 2023-09-19 ENCOUNTER — Other Ambulatory Visit: Payer: Self-pay | Admitting: Internal Medicine

## 2023-09-21 LAB — COLOGUARD: COLOGUARD: NEGATIVE

## 2023-10-03 ENCOUNTER — Other Ambulatory Visit: Payer: Self-pay | Admitting: Internal Medicine

## 2023-10-08 ENCOUNTER — Other Ambulatory Visit: Payer: Self-pay | Admitting: Internal Medicine

## 2023-10-08 MED ORDER — LEVOTHYROXINE SODIUM 88 MCG PO TABS: 88.0000 ug | ORAL_TABLET | Freq: Every day | ORAL | 1 refills | Status: DC

## 2023-10-15 DIAGNOSIS — M5412 Radiculopathy, cervical region: Secondary | ICD-10-CM | POA: Diagnosis not present

## 2023-11-11 ENCOUNTER — Encounter: Payer: Self-pay | Admitting: Internal Medicine

## 2023-11-11 DIAGNOSIS — M5412 Radiculopathy, cervical region: Secondary | ICD-10-CM | POA: Diagnosis not present

## 2023-11-12 DIAGNOSIS — R635 Abnormal weight gain: Secondary | ICD-10-CM | POA: Diagnosis not present

## 2023-11-12 DIAGNOSIS — R7301 Impaired fasting glucose: Secondary | ICD-10-CM | POA: Diagnosis not present

## 2023-11-12 DIAGNOSIS — E890 Postprocedural hypothyroidism: Secondary | ICD-10-CM | POA: Diagnosis not present

## 2023-11-13 ENCOUNTER — Other Ambulatory Visit: Payer: Self-pay | Admitting: Internal Medicine

## 2023-11-14 ENCOUNTER — Other Ambulatory Visit: Payer: Self-pay | Admitting: Internal Medicine

## 2023-11-14 MED ORDER — CLONAZEPAM 0.5 MG PO TABS: ORAL_TABLET | ORAL | 2 refills | Status: DC

## 2023-11-19 DIAGNOSIS — K5900 Constipation, unspecified: Secondary | ICD-10-CM | POA: Diagnosis not present

## 2023-12-24 ENCOUNTER — Encounter: Payer: Self-pay | Admitting: Internal Medicine

## 2023-12-30 DIAGNOSIS — R635 Abnormal weight gain: Secondary | ICD-10-CM | POA: Diagnosis not present

## 2023-12-30 DIAGNOSIS — L80 Vitiligo: Secondary | ICD-10-CM | POA: Diagnosis not present

## 2023-12-30 DIAGNOSIS — E890 Postprocedural hypothyroidism: Secondary | ICD-10-CM | POA: Diagnosis not present

## 2023-12-30 DIAGNOSIS — E669 Obesity, unspecified: Secondary | ICD-10-CM | POA: Diagnosis not present

## 2024-01-16 DIAGNOSIS — L80 Vitiligo: Secondary | ICD-10-CM | POA: Diagnosis not present

## 2024-01-20 ENCOUNTER — Ambulatory Visit: Payer: Federal, State, Local not specified - PPO | Admitting: Internal Medicine

## 2024-01-23 ENCOUNTER — Ambulatory Visit: Payer: Federal, State, Local not specified - PPO | Admitting: Internal Medicine

## 2024-01-27 DIAGNOSIS — Z1231 Encounter for screening mammogram for malignant neoplasm of breast: Secondary | ICD-10-CM | POA: Diagnosis not present

## 2024-01-27 DIAGNOSIS — Z01411 Encounter for gynecological examination (general) (routine) with abnormal findings: Secondary | ICD-10-CM | POA: Diagnosis not present

## 2024-01-27 DIAGNOSIS — N951 Menopausal and female climacteric states: Secondary | ICD-10-CM | POA: Diagnosis not present

## 2024-01-27 DIAGNOSIS — H1131 Conjunctival hemorrhage, right eye: Secondary | ICD-10-CM | POA: Diagnosis not present

## 2024-01-27 DIAGNOSIS — Z01419 Encounter for gynecological examination (general) (routine) without abnormal findings: Secondary | ICD-10-CM | POA: Diagnosis not present

## 2024-02-06 ENCOUNTER — Ambulatory Visit: Payer: Federal, State, Local not specified - PPO | Admitting: Internal Medicine

## 2024-02-06 ENCOUNTER — Encounter: Payer: Self-pay | Admitting: Internal Medicine

## 2024-02-06 VITALS — BP 110/72 | HR 89 | Temp 98.5°F | Ht 65.0 in | Wt 202.0 lb

## 2024-02-06 DIAGNOSIS — F3342 Major depressive disorder, recurrent, in full remission: Secondary | ICD-10-CM

## 2024-02-06 DIAGNOSIS — F4320 Adjustment disorder, unspecified: Secondary | ICD-10-CM | POA: Diagnosis not present

## 2024-02-06 DIAGNOSIS — F419 Anxiety disorder, unspecified: Secondary | ICD-10-CM

## 2024-02-06 DIAGNOSIS — R7303 Prediabetes: Secondary | ICD-10-CM

## 2024-02-06 DIAGNOSIS — F439 Reaction to severe stress, unspecified: Secondary | ICD-10-CM

## 2024-02-06 DIAGNOSIS — E038 Other specified hypothyroidism: Secondary | ICD-10-CM

## 2024-02-06 MED ORDER — PHENTERMINE HCL 37.5 MG PO TABS: 37.5000 mg | ORAL_TABLET | Freq: Every day | ORAL | 2 refills | Status: DC

## 2024-02-06 NOTE — Assessment & Plan Note (Signed)
On Citalopram, clonazepam prn

## 2024-02-06 NOTE — Progress Notes (Signed)
 Subjective:  Patient ID: April Elliott, female    DOB: [birth year]  Age: 58 y.o. MRN: [id number]  CC: Medical Management of Chronic Issues (6 mnth f/u, Issues with allergies and increase with stress due to work.)   HPI April Elliott presents for anxiety, depression - worse due to stress at work ([place]). Seeing Dr Talmage Nap - off [phone]   Outpatient Medications Prior to Visit  Medication Sig Dispense Refill   Bacillus Coagulans-Inulin (PROBIOTIC) 1-250 BILLION-MG CAPS Take 1 capsule by mouth daily.     celecoxib (CELEBREX) 200 MG capsule Take 1 capsule (200 mg total) by mouth daily. Per MD Return in about 6 months (around 08/31/2023) for a follow-up visit. 90 capsule 0   Cholecalciferol 50 MCG (2000 UT) CAPS 1 tablet Orally Once a day     citalopram (CELEXA) 40 MG tablet Take 1 tablet (40 mg total) by mouth daily. 90 tablet 0   clonazePAM (KLONOPIN) 0.5 MG tablet TAKE 1 TABLET BY MOUTH EVERY DAY FOR ANXIETY 30 tablet 2   COMBIPATCH 0.05-0.14 MG/DAY Place 1 patch onto the skin 2 (two) times a week.     cyclobenzaprine (FLEXERIL) 10 MG tablet Take 10 mg by mouth at bedtime.     Fluocinolone Acetonide Scalp 0.01 % OIL Apply to the scalp three times weekly     fluticasone (FLONASE) 50 MCG/ACT nasal spray 1-2 SPRAY IN EACH NOSTRIL NASALLY ONCE A DAY 30 DAY(S)     gabapentin (NEURONTIN) 300 MG capsule gabapentin 300 mg capsule  1 CAPSULE EVERY MORNING , 1 AFTERNOON, 2 QHS, ORALLY 30 DAYS     Krill Oil (OMEGA-3) 500 MG CAPS Take by mouth.     levothyroxine (SYNTHROID) 88 MCG tablet Take 1 tablet (88 mcg total) by mouth daily. 90 tablet 3   levothyroxine (SYNTHROID) 88 MCG tablet Take 1 tablet (88 mcg total) by mouth daily. 90 tablet 1   liothyronine (CYTOMEL) 5 MCG tablet Take 1 tablet (5 mcg total) by mouth daily. Per MD Return in about 6 months (around 08/31/2023) for a follow-up visit. 90 tablet 0   loratadine (CLARITIN) 10 MG tablet Take by mouth.     Magnesium 300 MG CAPS 1 capsule with a meal  Orally Once a day     melatonin 3 MG TABS tablet Take 2 tablets (6 mg total) by mouth at bedtime.  0   methocarbamol (ROBAXIN) 500 MG tablet Take 500-1,000 mg by mouth every 6 (six) hours as needed.     microfibrillar collagen (AVITENE FLOUR) powder Apply topically.     minoxidil (LONITEN) 2.5 MG tablet Take 0.5 tablets by mouth daily.     Multiple Vitamins-Minerals (MULTIVITAMIN GUMMIES ADULT) CHEW Chew 1 tablet by mouth daily.     Plecanatide (TRULANCE) 3 MG TABS Take 1 tablet by mouth daily. 30 tablet    Ruxolitinib Phosphate (OPZELURA) 1.5 % CREA Apply topically.     WEGOVY 0.25 MG/0.5ML SOAJ Inject into the skin.     Oyster Shell Calcium 500 MG TABS 1 tablet with meals Orally Twice a day     tacrolimus (PROTOPIC) 0.03 % ointment APPLY TOPICALLY 2 TIMES DAILY. TO AFFECTED AREAS (FACE)     Ferrous Sulfate (IRON PO) Take by mouth.     Fezolinetant (VEOZAH) 45 MG TABS      Oyster Shell Calcium 500 MG TABS 1 tablet with meals Orally Twice a day     No facility-administered medications prior to visit.    ROS: Review of Systems  Constitutional:  Negative for activity change, appetite change, chills, fatigue and unexpected weight change.  HENT:  Negative for congestion, mouth sores and sinus pressure.   Eyes:  Negative for visual disturbance.  Respiratory:  Negative for cough and chest tightness.   Gastrointestinal:  Negative for abdominal pain and nausea.  Genitourinary:  Negative for difficulty urinating, frequency and vaginal pain.  Musculoskeletal:  Negative for back pain and gait problem.  Skin:  Negative for pallor and rash.  Neurological:  Negative for dizziness, tremors, weakness, numbness and headaches.  Psychiatric/Behavioral:  Positive for decreased concentration and dysphoric mood. Negative for confusion, sleep disturbance and suicidal ideas. The patient is nervous/anxious.     Objective:  BP 110/72   Pulse 89   Temp 98.5 F (36.9 C) (Oral)   Ht 5\' 5"  (1.651 m)   Wt 202  lb (91.6 kg)   LMP 05/18/2011   SpO2 94%   BMI 33.61 kg/m   BP Readings from Last 3 Encounters:  02/06/24 110/72  07/17/23 112/70  02/28/23 118/80    Wt Readings from Last 3 Encounters:  02/06/24 202 lb (91.6 kg)  07/17/23 203 lb (92.1 kg)  02/28/23 206 lb (93.4 kg)    Physical Exam Constitutional:      General: She is not in acute distress.    Appearance: She is well-developed. She is obese.  HENT:     Head: Normocephalic.     Right Ear: External ear normal.     Left Ear: External ear normal.     Nose: Nose normal.  Eyes:     General:        Right eye: No discharge.        Left eye: No discharge.     Conjunctiva/sclera: Conjunctivae normal.     Pupils: Pupils are equal, round, and reactive to light.  Neck:     Thyroid: No thyromegaly.     Vascular: No JVD.     Trachea: No tracheal deviation.  Cardiovascular:     Rate and Rhythm: Normal rate and regular rhythm.     Heart sounds: Normal heart sounds.  Pulmonary:     Effort: No respiratory distress.     Breath sounds: No stridor. No wheezing.  Abdominal:     General: Bowel sounds are normal. There is no distension.     Palpations: Abdomen is soft. There is no mass.     Tenderness: There is no abdominal tenderness. There is no guarding or rebound.  Musculoskeletal:        General: No tenderness.     Cervical back: Normal range of motion and neck supple. No rigidity.  Lymphadenopathy:     Cervical: No cervical adenopathy.  Skin:    Findings: No erythema or rash.  Neurological:     Cranial Nerves: No cranial nerve deficit.     Motor: No abnormal muscle tone.     Coordination: Coordination normal.     Deep Tendon Reflexes: Reflexes normal.  Psychiatric:        Behavior: Behavior normal.        Thought Content: Thought content normal.        Judgment: Judgment normal.     Lab Results  Component Value Date   WBC 5.2 02/20/2023   HGB 14.9 02/20/2023   HCT 45.0 02/20/2023   PLT 135 (L) 02/20/2023    GLUCOSE 90 02/20/2023   CHOL 155 02/20/2023   TRIG 72 02/20/2023   HDL 60 02/20/2023   LDLCALC 81 02/20/2023  ALT 11 02/20/2023   AST 12 02/20/2023   NA 143 02/20/2023   K 4.5 02/20/2023   CL 107 (H) 02/20/2023   CREATININE 0.93 02/20/2023   BUN 14 02/20/2023   CO2 24 02/20/2023   TSH 0.723 02/20/2023    CT CARDIAC SCORING ([facility] LOCATIONS ONLY) Result Date: 05/03/2023 CLINICAL DATA:  59 year old African American female with history of dyslipidemia and family history of coronary artery disease. * Tracking Code: FCC * EXAM: CT CARDIAC CORONARY ARTERY CALCIUM SCORE TECHNIQUE: Non-contrast imaging through the heart was performed using prospective ECG gating. Image post processing was performed on an independent workstation, allowing for quantitative analysis of the heart and coronary arteries. Note that this exam targets the heart and the chest was not imaged in its entirety. COMPARISON:  No priors. FINDINGS: CORONARY CALCIUM SCORES: Left Main: 0 LAD: 16 LCx: 0 RCA: 0 Total Agatston Score: 16 MESA database percentile: 83rd AORTA MEASUREMENTS: Ascending Aorta: 3.2 cm Descending Aorta:2.4 cm OTHER FINDINGS: Within the visualized portions of the thorax there are no suspicious appearing pulmonary nodules or masses, there is no acute consolidative airspace disease, no pleural effusions, no pneumothorax and no lymphadenopathy. Visualized portions of the upper abdomen are unremarkable. There are no aggressive appearing lytic or blastic lesions noted in the visualized portions of the skeleton. IMPRESSION: 1. Patient's total coronary artery calcium score is 16 which is 83rd percentile for patient's of matched age, gender and race/ethnicity. Please note that although the presence of coronary artery calcium documents the presence of coronary artery disease, the severity of this disease and any potential stenosis cannot be assessed on this noncontrast CT examination. Assessment for potential risk factor  modification, dietary therapy or pharmacologic therapy may be warranted, if clinically indicated. 2. No significant incidental noncardiac findings are noted. Electronically Signed   By: Trudie Reed M.D.   On: 05/03/2023 10:47    Assessment & Plan:   Problem List Items Addressed This Visit     Anxiety   On Citalopram, clonazepam prn      Hypothyroidism - Primary   Dr Talmage Nap On Synthroid, Cytomel      Recurrent major depression in full remission (HCC)   Stress at work Discussed Use Clonazepam on a bad day      Stress   Stress at work - worse Discussed Use Clonazepam on a bad day      Adjustment reaction of adult life   Stress at work Discussed Use Clonazepam on a bad day      Pre-diabetes   Off Wegovy Try Phentermine if tolerated  Potential benefits of a long term phentermine use as well as potential risks  and complications were explained to the patient and were aknowledged.          Meds ordered this encounter  Medications   phentermine (ADIPEX-P) 37.5 MG tablet    Sig: Take 1 tablet (37.5 mg total) by mouth daily before breakfast.    Dispense:  30 tablet    Refill:  2      Follow-up: Return in about 2 months (around 04/07/2024) for a follow-up visit.  Sonda Primes, MD

## 2024-02-06 NOTE — Assessment & Plan Note (Signed)
 Off Wegovy Try Phentermine if tolerated  Potential benefits of a long term phentermine use as well as potential risks  and complications were explained to the patient and were aknowledged.

## 2024-02-06 NOTE — Assessment & Plan Note (Signed)
 Stress at work Discussed Use Clonazepam on a bad day

## 2024-02-06 NOTE — Assessment & Plan Note (Signed)
 Stress at work - worse Discussed Use Clonazepam on a bad day

## 2024-02-06 NOTE — Assessment & Plan Note (Signed)
Dr Chalmers Cater On Synthroid, Cytomel

## 2024-02-22 ENCOUNTER — Other Ambulatory Visit: Payer: Self-pay | Admitting: Internal Medicine

## 2024-02-26 ENCOUNTER — Encounter: Payer: Self-pay | Admitting: Internal Medicine

## 2024-03-09 ENCOUNTER — Other Ambulatory Visit: Payer: Self-pay | Admitting: Internal Medicine

## 2024-03-09 MED ORDER — AZELASTINE-FLUTICASONE 137-50 MCG/ACT NA SUSP: 1.0000 | NASAL | 5 refills | Status: DC

## 2024-03-16 ENCOUNTER — Other Ambulatory Visit: Payer: Self-pay

## 2024-03-26 ENCOUNTER — Other Ambulatory Visit: Payer: Self-pay

## 2024-03-26 MED ORDER — AZELASTINE-FLUTICASONE 137-50 MCG/ACT NA SUSP: 1.0000 | NASAL | 5 refills | Status: DC

## 2024-03-26 NOTE — Telephone Encounter (Signed)
 Re-sent to pharmacy.

## 2024-04-02 ENCOUNTER — Other Ambulatory Visit: Payer: Self-pay | Admitting: Internal Medicine

## 2024-04-02 ENCOUNTER — Encounter: Payer: Self-pay | Admitting: Internal Medicine

## 2024-04-02 DIAGNOSIS — R7301 Impaired fasting glucose: Secondary | ICD-10-CM | POA: Diagnosis not present

## 2024-04-02 DIAGNOSIS — E890 Postprocedural hypothyroidism: Secondary | ICD-10-CM | POA: Diagnosis not present

## 2024-04-06 ENCOUNTER — Other Ambulatory Visit: Payer: Self-pay | Admitting: Internal Medicine

## 2024-04-06 MED ORDER — CELECOXIB 200 MG PO CAPS: 200.0000 mg | ORAL_CAPSULE | Freq: Every day | ORAL | 0 refills | Status: DC

## 2024-04-06 MED ORDER — CLONAZEPAM 0.5 MG PO TABS: ORAL_TABLET | ORAL | 2 refills | Status: DC

## 2024-04-07 DIAGNOSIS — E890 Postprocedural hypothyroidism: Secondary | ICD-10-CM | POA: Diagnosis not present

## 2024-04-07 DIAGNOSIS — E669 Obesity, unspecified: Secondary | ICD-10-CM | POA: Diagnosis not present

## 2024-04-07 DIAGNOSIS — R635 Abnormal weight gain: Secondary | ICD-10-CM | POA: Diagnosis not present

## 2024-04-07 DIAGNOSIS — L80 Vitiligo: Secondary | ICD-10-CM | POA: Diagnosis not present

## 2024-04-08 ENCOUNTER — Ambulatory Visit: Admitting: Internal Medicine

## 2024-04-28 ENCOUNTER — Ambulatory Visit: Admitting: Internal Medicine

## 2024-05-05 DIAGNOSIS — M5412 Radiculopathy, cervical region: Secondary | ICD-10-CM | POA: Diagnosis not present

## 2024-05-21 ENCOUNTER — Ambulatory Visit: Admitting: Internal Medicine

## 2024-05-21 ENCOUNTER — Encounter: Payer: Self-pay | Admitting: Internal Medicine

## 2024-05-21 VITALS — BP 110/70 | HR 87 | Temp 98.6°F | Ht 65.0 in | Wt 197.0 lb

## 2024-05-21 DIAGNOSIS — F32A Depression, unspecified: Secondary | ICD-10-CM | POA: Insufficient documentation

## 2024-05-21 DIAGNOSIS — G250 Essential tremor: Secondary | ICD-10-CM | POA: Diagnosis not present

## 2024-05-21 DIAGNOSIS — F3341 Major depressive disorder, recurrent, in partial remission: Secondary | ICD-10-CM | POA: Diagnosis not present

## 2024-05-21 DIAGNOSIS — F419 Anxiety disorder, unspecified: Secondary | ICD-10-CM | POA: Diagnosis not present

## 2024-05-21 DIAGNOSIS — R109 Unspecified abdominal pain: Secondary | ICD-10-CM | POA: Insufficient documentation

## 2024-05-21 DIAGNOSIS — M542 Cervicalgia: Secondary | ICD-10-CM | POA: Diagnosis not present

## 2024-05-21 DIAGNOSIS — R3915 Urgency of urination: Secondary | ICD-10-CM | POA: Insufficient documentation

## 2024-05-21 MED ORDER — VORTIOXETINE HBR 10 MG PO TABS: 10.0000 mg | ORAL_TABLET | Freq: Every day | ORAL | 5 refills | Status: DC

## 2024-05-21 NOTE — Progress Notes (Signed)
 Subjective:  Patient ID: April Elliott, female    DOB: 09-Jun-1966  Age: 58 y.o. MRN: [id number]  CC: Medical Management of Chronic Issues (2 MNTH F/U)   HPI Moriah Shawley presents for neck OA C/o head tremor x weeks or months F/u on depression    Outpatient Medications Prior to Visit  Medication Sig Dispense Refill   Azelastine -Fluticasone  (DYMISTA ) 137-50 MCG/ACT SUSP Place 1 Act into the nose 1 day or 1 dose. 23 g 5   Bacillus Coagulans-Inulin (PROBIOTIC) 1-250 BILLION-MG CAPS Take 1 capsule by mouth daily.     celecoxib  (CELEBREX ) 200 MG capsule Take 1 capsule (200 mg total) by mouth daily. Per MD Return in about 6 months (around 08/31/2023) for a follow-up visit. 90 capsule 0   Cholecalciferol 50 MCG (2000 UT) CAPS 1 tablet Orally Once a day     clonazePAM  (KLONOPIN ) 0.5 MG tablet TAKE 1 TABLET BY MOUTH EVERY DAY FOR ANXIETY 30 tablet 2   COMBIPATCH 0.05-0.14 MG/DAY Place 1 patch onto the skin 2 (two) times a week.     fluconazole (DIFLUCAN) 150 MG tablet Take 150 mg by mouth once.     Fluocinolone Acetonide Scalp 0.01 % OIL Apply to the scalp three times weekly     gabapentin (NEURONTIN) 300 MG capsule gabapentin 300 mg capsule  1 CAPSULE EVERY MORNING , 1 AFTERNOON, 2 QHS, ORALLY 30 DAYS     Krill Oil (OMEGA-3) 500 MG CAPS Take by mouth.     levothyroxine  (SYNTHROID ) 88 MCG tablet Take 1 tablet (88 mcg total) by mouth daily. 90 tablet 3   liothyronine  (CYTOMEL ) 5 MCG tablet Take 1 tablet (5 mcg total) by mouth daily. Per MD Return in about 6 months (around 08/31/2023) for a follow-up visit. 90 tablet 0   loratadine (CLARITIN) 10 MG tablet Take by mouth.     Magnesium 300 MG CAPS 1 capsule with a meal Orally Once a day     melatonin 3 MG TABS tablet Take 2 tablets (6 mg total) by mouth at bedtime.  0   methocarbamol (ROBAXIN) 500 MG tablet Take 500-1,000 mg by mouth every 6 (six) hours as needed.     microfibrillar collagen (AVITENE FLOUR) powder Apply topically.      minoxidil (LONITEN) 2.5 MG tablet Take 0.5 tablets by mouth daily.     Multiple Vitamins-Minerals (MULTIVITAMIN GUMMIES ADULT) CHEW Chew 1 tablet by mouth daily.     Oyster Shell Calcium 500 MG TABS 1 tablet with meals Orally Twice a day     phentermine  (ADIPEX-P ) 37.5 MG tablet Take 1 tablet (37.5 mg total) by mouth daily before breakfast. 30 tablet 2   Plecanatide  (TRULANCE ) 3 MG TABS Take 1 tablet by mouth daily. 30 tablet    Ruxolitinib Phosphate (OPZELURA) 1.5 % CREA Apply topically.     WEGOVY 0.25 MG/0.5ML SOAJ Inject into the skin.     citalopram  (CELEXA ) 40 MG tablet TAKE 1 TABLET BY MOUTH EVERY DAY 90 tablet 1   cyclobenzaprine (FLEXERIL) 10 MG tablet Take 10 mg by mouth at bedtime.     levothyroxine  (SYNTHROID ) 88 MCG tablet Take 1 tablet (88 mcg total) by mouth daily. 90 tablet 1   tacrolimus (PROTOPIC) 0.03 % ointment APPLY TOPICALLY 2 TIMES DAILY. TO AFFECTED AREAS (FACE)     No facility-administered medications prior to visit.    ROS: Review of Systems  Constitutional:  Negative for activity change, appetite change, chills, fatigue and unexpected weight change.  HENT:  Negative for  congestion, mouth sores and sinus pressure.   Eyes:  Negative for visual disturbance.  Respiratory:  Negative for cough and chest tightness.   Gastrointestinal:  Negative for abdominal pain and nausea.  Genitourinary:  Negative for difficulty urinating, frequency and vaginal pain.  Musculoskeletal:  Positive for arthralgias, neck pain and neck stiffness. Negative for back pain and gait problem.  Skin:  Positive for color change. Negative for pallor and rash.  Neurological:  Positive for tremors. Negative for dizziness, weakness, numbness and headaches.  Psychiatric/Behavioral:  Positive for dysphoric mood. Negative for confusion, sleep disturbance and suicidal ideas.     Objective:  BP 110/70   Pulse 87   Temp 98.6 F (37 C) (Oral)   Ht 5' 5 (1.651 m)   Wt 197 lb (89.4 kg)   LMP  05/18/2011   SpO2 95%   BMI 32.78 kg/m   BP Readings from Last 3 Encounters:  05/21/24 110/70  02/06/24 110/72  07/17/23 112/70    Wt Readings from Last 3 Encounters:  05/21/24 197 lb (89.4 kg)  02/06/24 202 lb (91.6 kg)  07/17/23 203 lb (92.1 kg)    Physical Exam Constitutional:      General: She is not in acute distress.    Appearance: She is well-developed. She is obese.  HENT:     Head: Normocephalic.     Right Ear: External ear normal.     Left Ear: External ear normal.     Nose: Nose normal.   Eyes:     General:        Right eye: No discharge.        Left eye: No discharge.     Conjunctiva/sclera: Conjunctivae normal.     Pupils: Pupils are equal, round, and reactive to light.   Neck:     Thyroid : No thyromegaly.     Vascular: No JVD.     Trachea: No tracheal deviation.   Cardiovascular:     Rate and Rhythm: Normal rate and regular rhythm.     Heart sounds: Normal heart sounds.  Pulmonary:     Effort: No respiratory distress.     Breath sounds: No stridor. No wheezing.  Abdominal:     General: Bowel sounds are normal. There is no distension.     Palpations: Abdomen is soft. There is no mass.     Tenderness: There is no abdominal tenderness. There is no guarding or rebound.   Musculoskeletal:        General: Tenderness present.     Cervical back: Normal range of motion and neck supple. No rigidity.     Right lower leg: No edema.     Left lower leg: No edema.  Lymphadenopathy:     Cervical: No cervical adenopathy.   Skin:    Findings: No erythema or rash.   Neurological:     Mental Status: She is oriented to person, place, and time.     Cranial Nerves: No cranial nerve deficit.     Motor: No abnormal muscle tone.     Coordination: Coordination normal.     Deep Tendon Reflexes: Reflexes normal.   Psychiatric:        Behavior: Behavior normal.        Thought Content: Thought content normal.        Judgment: Judgment normal.   Mild head  tremor Vetiligo   Lab Results  Component Value Date   WBC 5.2 02/20/2023   HGB 14.9 02/20/2023   HCT 45.0  02/20/2023   PLT 135 (L) 02/20/2023   GLUCOSE 90 02/20/2023   CHOL 155 02/20/2023   TRIG 72 02/20/2023   HDL 60 02/20/2023   LDLCALC 81 02/20/2023   ALT 11 02/20/2023   AST 12 02/20/2023   NA 143 02/20/2023   K 4.5 02/20/2023   CL 107 (H) 02/20/2023   CREATININE 0.93 02/20/2023   BUN 14 02/20/2023   CO2 24 02/20/2023   TSH 0.723 02/20/2023    CT CARDIAC SCORING ([facility] LOCATIONS ONLY) Result Date: 05/03/2023 CLINICAL DATA:  58 year old African American female with history of dyslipidemia and family history of coronary artery disease. * Tracking Code: FCC * EXAM: CT CARDIAC CORONARY ARTERY CALCIUM SCORE TECHNIQUE: Non-contrast imaging through the heart was performed using prospective ECG gating. Image post processing was performed on an independent workstation, allowing for quantitative analysis of the heart and coronary arteries. Note that this exam targets the heart and the chest was not imaged in its entirety. COMPARISON:  No priors. FINDINGS: CORONARY CALCIUM SCORES: Left Main: 0 LAD: 16 LCx: 0 RCA: 0 Total Agatston Score: 16 MESA database percentile: 83rd AORTA MEASUREMENTS: Ascending Aorta: 3.2 cm Descending Aorta:2.4 cm OTHER FINDINGS: Within the visualized portions of the thorax there are no suspicious appearing pulmonary nodules or masses, there is no acute consolidative airspace disease, no pleural effusions, no pneumothorax and no lymphadenopathy. Visualized portions of the upper abdomen are unremarkable. There are no aggressive appearing lytic or blastic lesions noted in the visualized portions of the skeleton. IMPRESSION: 1. Patient's total coronary artery calcium score is 16 which is 83rd percentile for patient's of matched age, gender and race/ethnicity. Please note that although the presence of coronary artery calcium documents the presence of coronary artery disease, the  severity of this disease and any potential stenosis cannot be assessed on this noncontrast CT examination. Assessment for potential risk factor modification, dietary therapy or pharmacologic therapy may be warranted, if clinically indicated. 2. No significant incidental noncardiac findings are noted. Electronically Signed   By: Alexandria Angel M.D.   On: 05/03/2023 10:47    Assessment & Plan:   Problem List Items Addressed This Visit     Anxiety - Primary   D/c Celexa  - due to head tremor Start Trintellix 10 mg/d      Relevant Medications   vortioxetine HBr (TRINTELLIX) 10 MG TABS tablet   Neck pain   Worse - epidural inj is pending Pt should be working from home due to medical reasons      Depression   D/c Celexa  - due to head tremor Start Trintellix 10 mg/d Pt should be working from home due to medical reasons      Relevant Medications   vortioxetine HBr (TRINTELLIX) 10 MG TABS tablet   Benign head tremor   Discussed D/c Celexa  - due to head tremor Start Trintellix 10 mg/d Neurology ref was offered             Meds ordered this encounter  Medications   vortioxetine HBr (TRINTELLIX) 10 MG TABS tablet    Sig: Take 1 tablet (10 mg total) by mouth daily.    Dispense:  30 tablet    Refill:  5    Failed Celexa , Effexor      Follow-up: Return in about 3 months (around 08/21/2024).  Anitra Barn, MD

## 2024-05-21 NOTE — Assessment & Plan Note (Addendum)
 D/c Celexa  - due to head tremor Start Trintellix 10 mg/d Pt should be working from home due to medical reasons

## 2024-05-21 NOTE — Assessment & Plan Note (Signed)
 D/c Celexa  - due to head tremor Start Trintellix 10 mg/d

## 2024-05-21 NOTE — Assessment & Plan Note (Signed)
 Discussed D/c Celexa  - due to head tremor Start Trintellix 10 mg/d Neurology ref was offered

## 2024-05-21 NOTE — Assessment & Plan Note (Signed)
 Worse - epidural inj is pending Pt should be working from home due to medical reasons

## 2024-05-25 ENCOUNTER — Encounter: Payer: Self-pay | Admitting: Internal Medicine

## 2024-05-26 DIAGNOSIS — M5412 Radiculopathy, cervical region: Secondary | ICD-10-CM | POA: Diagnosis not present

## 2024-06-02 NOTE — Telephone Encounter (Signed)
 Please send in a PA for Trintellix  10 mg tab. Dx for this medication is F33.41 and 2nd dx is G25.0 Benign Head Tremor.   Pt failed Zoloft, Effexor, Paxil 05/2024 D/c Celexa  - due to head tremor Start Trintellix  10 mg/d

## 2024-06-03 ENCOUNTER — Other Ambulatory Visit (HOSPITAL_COMMUNITY): Payer: Self-pay

## 2024-06-03 ENCOUNTER — Telehealth: Payer: Self-pay

## 2024-06-03 NOTE — Telephone Encounter (Signed)
 Pharmacy Patient Advocate Encounter   Received notification from Patient Advice Request messages that prior authorization for Trintellix  10mg  tabs is required/requested.   Insurance verification completed.   The patient is insured through [facility] .   Per test claim: PA required; PA submitted to above mentioned insurance via CoverMyMeds Key/confirmation #/EOC [id number] Status is pending

## 2024-06-03 NOTE — Telephone Encounter (Signed)
 A user error has taken place: orders placed in error, not carried out on this patient.

## 2024-06-03 NOTE — Telephone Encounter (Signed)
 Pharmacy Patient Advocate Encounter  Received notification from [facility] that Prior Authorization for Trintellix  10mg  tab has been CANCELLED due to PA has been resolved, no additional PA is required.      Ran test claim for Trintellix  10mg . Currently a quantity of 30 tabs is a 30 day supply and the co-pay is $244.69 .   This test claim was processed through [facility]- copay amounts may vary at other pharmacies due to pharmacy/plan contracts, or as the patient moves through the different stages of their insurance plan.

## 2024-06-19 NOTE — Telephone Encounter (Signed)
 Copied from CRM [id number]. Topic: Clinical - Medication Question >> Jun 19, 2024 12:46 PM Chiquita SQUIBB wrote: Reason for CRM: Patient is calling in stating that she got the Trintellix  for 10 mg in the samples but only has a few pills since she went to 20 mg. Patient is asking she needs to get more samples or what she should do? Patient stated she thought she was getting 3 months but only received 3 weeks. Please advise the patient.

## 2024-06-22 ENCOUNTER — Other Ambulatory Visit: Payer: Self-pay | Admitting: Internal Medicine

## 2024-06-22 ENCOUNTER — Telehealth: Payer: Self-pay

## 2024-06-22 DIAGNOSIS — M5412 Radiculopathy, cervical region: Secondary | ICD-10-CM | POA: Diagnosis not present

## 2024-06-22 DIAGNOSIS — G4733 Obstructive sleep apnea (adult) (pediatric): Secondary | ICD-10-CM | POA: Diagnosis not present

## 2024-06-22 MED ORDER — VORTIOXETINE HBR 10 MG PO TABS: 10.0000 mg | ORAL_TABLET | Freq: Every day | ORAL | 1 refills | Status: DC

## 2024-06-22 NOTE — Telephone Encounter (Unsigned)
 Copied from CRM [id number]. Topic: Clinical - Medication Question >> Jun 22, 2024  2:54 PM Taleah C wrote: Reason for CRM: patient called back to follow-up on the crm that was sent 7/18 that reads:  Patient is calling in stating that she got the Trintellix  for 10 mg in the samples but only has a few pills since she went to 20 mg. Patient is asking she needs to get more samples or what she should do? Patient stated she thought she was getting 3 months but only received 3 weeks. Please advise the patient.  Please call back and advise at [phone]

## 2024-06-23 NOTE — Telephone Encounter (Signed)
 PA team ran test claim for Trintellix  10mg . Currently a quantity of 30 tabs is a 30 day supply and the co-pay is $244.69...  Pt has called and stated patient called back to follow-up on the crm that was sent 7/18 that reads: Patient is calling in stating that she got the Trintellix  for 10 mg in the samples but only has a few pills since she went to 20 mg. Patient is asking she needs to get more samples or what she should do? Patient stated she thought she was getting 3 months but only received 3 weeks. Please advise the patient

## 2024-06-24 MED ORDER — VILAZODONE HCL 20 MG PO TABS: 20.0000 mg | ORAL_TABLET | Freq: Every day | ORAL | 6 refills | Status: DC

## 2024-06-24 NOTE — Addendum Note (Signed)
 Addended by: Jeslyn Amsler V on: 06/24/2024 07:42 AM   Modules accepted: Orders

## 2024-06-24 NOTE — Telephone Encounter (Signed)
 I will send the prescription for Viibryd  instead.  Thanks

## 2024-07-07 ENCOUNTER — Telehealth: Payer: Self-pay | Admitting: Pharmacy Technician

## 2024-07-07 ENCOUNTER — Other Ambulatory Visit (HOSPITAL_COMMUNITY): Payer: Self-pay

## 2024-07-07 NOTE — Telephone Encounter (Signed)
 Pharmacy Patient Advocate Encounter   Received notification from CoverMyMeds that prior authorization for Celecoxib  200MG  capsules is due for renewal.   Insurance verification completed.   The patient is insured through [facility].  Action: PA required and submitted KEY/EOC/Request #: [id number] APPROVED from 06/07/24 to 07/07/25

## 2024-07-19 ENCOUNTER — Other Ambulatory Visit: Payer: Self-pay | Admitting: Internal Medicine

## 2024-07-29 DIAGNOSIS — D229 Melanocytic nevi, unspecified: Secondary | ICD-10-CM | POA: Diagnosis not present

## 2024-07-29 DIAGNOSIS — L80 Vitiligo: Secondary | ICD-10-CM | POA: Diagnosis not present

## 2024-08-11 ENCOUNTER — Other Ambulatory Visit: Payer: Self-pay | Admitting: Internal Medicine

## 2024-08-11 NOTE — Telephone Encounter (Signed)
 Refill request Klonopin  0.5mg  30tab 2refills; Last fill 04/06/24; Last OV 02/06/24

## 2024-08-24 ENCOUNTER — Encounter: Payer: Self-pay | Admitting: Internal Medicine

## 2024-08-24 ENCOUNTER — Ambulatory Visit: Admitting: Internal Medicine

## 2024-08-24 VITALS — BP 100/72 | HR 83 | Temp 98.1°F | Ht 64.5 in | Wt 199.2 lb

## 2024-08-24 DIAGNOSIS — F419 Anxiety disorder, unspecified: Secondary | ICD-10-CM

## 2024-08-24 DIAGNOSIS — F3341 Major depressive disorder, recurrent, in partial remission: Secondary | ICD-10-CM | POA: Diagnosis not present

## 2024-08-24 DIAGNOSIS — I2583 Coronary atherosclerosis due to lipid rich plaque: Secondary | ICD-10-CM

## 2024-08-24 DIAGNOSIS — R7303 Prediabetes: Secondary | ICD-10-CM

## 2024-08-24 LAB — COMPREHENSIVE METABOLIC PANEL WITH GFR
ALT: 10 U/L (ref 0–35)
AST: 14 U/L (ref 0–37)
Albumin: 4.5 g/dL (ref 3.5–5.2)
Alkaline Phosphatase: 75 U/L (ref 39–117)
BUN: 13 mg/dL (ref 6–23)
CO2: 25 meq/L (ref 19–32)
Calcium: 9.5 mg/dL (ref 8.4–10.5)
Chloride: 108 meq/L (ref 96–112)
Creatinine, Ser: 0.91 mg/dL (ref 0.40–1.20)
GFR: 68.78 mL/min (ref >60.00)
Glucose, Bld: 101 mg/dL — ABNORMAL HIGH (ref 70–99)
Potassium: 4.2 meq/L (ref 3.5–5.1)
Sodium: 139 meq/L (ref 135–145)
Total Bilirubin: 0.4 mg/dL (ref 0.2–1.2)
Total Protein: 6.6 g/dL (ref 6.0–8.3)

## 2024-08-24 LAB — HEMOGLOBIN A1C: Hgb A1c MFr Bld: 6.4 % (ref 4.6–6.5)

## 2024-08-24 MED ORDER — PHENTERMINE HCL 37.5 MG PO TABS: 37.5000 mg | ORAL_TABLET | Freq: Every day | ORAL | 2 refills | Status: AC

## 2024-08-24 MED ORDER — VORTIOXETINE HBR 20 MG PO TABS: 20.0000 mg | ORAL_TABLET | Freq: Every day | ORAL | 3 refills | Status: AC

## 2024-08-24 NOTE — Assessment & Plan Note (Signed)
 Current management with Wegovy and Krill oil  Not interested in statins

## 2024-08-24 NOTE — Assessment & Plan Note (Signed)
 Off Wegovy Try Phentermine if tolerated  Potential benefits of a long term phentermine use as well as potential risks  and complications were explained to the patient and were aknowledged.

## 2024-08-24 NOTE — Progress Notes (Signed)
 Subjective:  Patient ID: April Elliott, female    DOB: 23-Jun-1966  Age: 58 y.o. MRN: [id number]  CC: Follow-up (Patient here to discuss impairments for working and to have paperwork filled out. )   HPI Evonda Enge presents for depression, anxiety Not on Wegovy - not covered  Outpatient Medications Prior to Visit  Medication Sig Dispense Refill   Azelastine -Fluticasone  (DYMISTA ) 137-50 MCG/ACT SUSP Place 1 Act into the nose 1 day or 1 dose. 23 g 5   Bacillus Coagulans-Inulin (PROBIOTIC) 1-250 BILLION-MG CAPS Take 1 capsule by mouth daily.     Cholecalciferol 50 MCG (2000 UT) CAPS 1 tablet Orally Once a day     clonazePAM  (KLONOPIN ) 0.5 MG tablet TAKE 1 TABLET BY MOUTH EVERY DAY FOR ANXIETY 30 tablet 2   COMBIPATCH 0.05-0.14 MG/DAY Place 1 patch onto the skin 2 (two) times a week.     Fluocinolone Acetonide Scalp 0.01 % OIL Apply to the scalp three times weekly     gabapentin (NEURONTIN) 300 MG capsule gabapentin 300 mg capsule  1 CAPSULE EVERY MORNING , 1 AFTERNOON, 2 QHS, ORALLY 30 DAYS     Krill Oil (OMEGA-3) 500 MG CAPS Take by mouth.     levothyroxine  (SYNTHROID ) 88 MCG tablet Take 1 tablet (88 mcg total) by mouth daily. 90 tablet 3   liothyronine  (CYTOMEL ) 5 MCG tablet Take 1 tablet (5 mcg total) by mouth daily. Per MD Return in about 6 months (around 08/31/2023) for a follow-up visit. 90 tablet 0   loratadine (CLARITIN) 10 MG tablet Take by mouth.     Magnesium 300 MG CAPS 1 capsule with a meal Orally Once a day     melatonin 3 MG TABS tablet Take 2 tablets (6 mg total) by mouth at bedtime.  0   methocarbamol (ROBAXIN) 500 MG tablet Take 500-1,000 mg by mouth every 6 (six) hours as needed.     microfibrillar collagen (AVITENE FLOUR) powder Apply topically.     minoxidil (LONITEN) 2.5 MG tablet Take 0.5 tablets by mouth daily.     Multiple Vitamins-Minerals (MULTIVITAMIN GUMMIES ADULT) CHEW Chew 1 tablet by mouth daily.     Plecanatide  (TRULANCE ) 3 MG TABS Take 1 tablet by  mouth daily. 30 tablet    Ruxolitinib Phosphate (OPZELURA) 1.5 % CREA Apply topically.     celecoxib  (CELEBREX ) 200 MG capsule Take 1 capsule (200 mg total) by mouth daily. Per MD Return in about 6 months (around 08/31/2023) for a follow-up visit. 90 capsule 0   fluconazole (DIFLUCAN) 150 MG tablet Take 150 mg by mouth once. (Patient not taking: Reported on 08/24/2024)     Oyster Shell Calcium 500 MG TABS 1 tablet with meals Orally Twice a day (Patient not taking: Reported on 08/24/2024)     Vilazodone  HCl 20 MG TABS TAKE 1 TABLET BY MOUTH EVERY DAY 90 tablet 1   phentermine  (ADIPEX-P ) 37.5 MG tablet Take 1 tablet (37.5 mg total) by mouth daily before breakfast. (Patient not taking: Reported on 08/24/2024) 30 tablet 2   WEGOVY 0.25 MG/0.5ML SOAJ Inject into the skin.     No facility-administered medications prior to visit.    ROS: Review of Systems  Constitutional:  Negative for activity change, appetite change, chills, fatigue and unexpected weight change.  HENT:  Negative for congestion, mouth sores and sinus pressure.   Eyes:  Negative for visual disturbance.  Respiratory:  Negative for cough and chest tightness.   Gastrointestinal:  Negative for abdominal pain and nausea.  Genitourinary:  Negative for difficulty urinating, frequency and vaginal pain.  Musculoskeletal:  Negative for back pain and gait problem.  Skin:  Negative for pallor and rash.  Neurological:  Negative for dizziness, tremors, weakness, numbness and headaches.  Psychiatric/Behavioral:  Positive for dysphoric mood. Negative for confusion, sleep disturbance and suicidal ideas. The patient is nervous/anxious.     Objective:  BP 100/72   Pulse 83   Temp 98.1 F (36.7 C) (Oral)   Ht 5' 4.5 (1.638 m)   Wt 199 lb 3.2 oz (90.4 kg)   LMP 05/18/2011   SpO2 95%   BMI 33.66 kg/m   BP Readings from Last 3 Encounters:  08/24/24 100/72  05/21/24 110/70  02/06/24 110/72    Wt Readings from Last 3 Encounters:  08/24/24  199 lb 3.2 oz (90.4 kg)  05/21/24 197 lb (89.4 kg)  02/06/24 202 lb (91.6 kg)    Physical Exam Constitutional:      General: She is not in acute distress.    Appearance: She is well-developed. She is obese.  HENT:     Head: Normocephalic.     Right Ear: External ear normal.     Left Ear: External ear normal.     Nose: Nose normal.  Eyes:     General:        Right eye: No discharge.        Left eye: No discharge.     Conjunctiva/sclera: Conjunctivae normal.     Pupils: Pupils are equal, round, and reactive to light.  Neck:     Thyroid : No thyromegaly.     Vascular: No JVD.     Trachea: No tracheal deviation.  Cardiovascular:     Rate and Rhythm: Normal rate and regular rhythm.     Heart sounds: Normal heart sounds.  Pulmonary:     Effort: No respiratory distress.     Breath sounds: No stridor. No wheezing.  Abdominal:     General: Bowel sounds are normal. There is no distension.     Palpations: Abdomen is soft. There is no mass.     Tenderness: There is no abdominal tenderness. There is no guarding or rebound.  Musculoskeletal:        General: No tenderness.     Cervical back: Normal range of motion and neck supple. No rigidity.     Right lower leg: No edema.     Left lower leg: No edema.  Lymphadenopathy:     Cervical: No cervical adenopathy.  Skin:    Findings: No erythema or rash.  Neurological:     Mental Status: She is oriented to person, place, and time.     Cranial Nerves: No cranial nerve deficit.     Motor: No abnormal muscle tone.     Coordination: Coordination normal.     Deep Tendon Reflexes: Reflexes normal.  Psychiatric:        Behavior: Behavior normal.        Thought Content: Thought content normal.        Judgment: Judgment normal.     Lab Results  Component Value Date   WBC 5.2 02/20/2023   HGB 14.9 02/20/2023   HCT 45.0 02/20/2023   PLT 135 (L) 02/20/2023   GLUCOSE 101 (H) 08/24/2024   CHOL 155 02/20/2023   TRIG 72 02/20/2023   HDL 60  02/20/2023   LDLCALC 81 02/20/2023   ALT 10 08/24/2024   AST 14 08/24/2024   NA 139 08/24/2024   K  4.2 08/24/2024   CL 108 08/24/2024   CREATININE 0.91 08/24/2024   BUN 13 08/24/2024   CO2 25 08/24/2024   TSH 0.723 02/20/2023   HGBA1C 6.4 08/24/2024    CT CARDIAC SCORING ([facility] LOCATIONS ONLY) Result Date: 05/03/2023 CLINICAL DATA:  58 year old African American female with history of dyslipidemia and family history of coronary artery disease. * Tracking Code: FCC * EXAM: CT CARDIAC CORONARY ARTERY CALCIUM SCORE TECHNIQUE: Non-contrast imaging through the heart was performed using prospective ECG gating. Image post processing was performed on an independent workstation, allowing for quantitative analysis of the heart and coronary arteries. Note that this exam targets the heart and the chest was not imaged in its entirety. COMPARISON:  No priors. FINDINGS: CORONARY CALCIUM SCORES: Left Main: 0 LAD: 16 LCx: 0 RCA: 0 Total Agatston Score: 16 MESA database percentile: 83rd AORTA MEASUREMENTS: Ascending Aorta: 3.2 cm Descending Aorta:2.4 cm OTHER FINDINGS: Within the visualized portions of the thorax there are no suspicious appearing pulmonary nodules or masses, there is no acute consolidative airspace disease, no pleural effusions, no pneumothorax and no lymphadenopathy. Visualized portions of the upper abdomen are unremarkable. There are no aggressive appearing lytic or blastic lesions noted in the visualized portions of the skeleton. IMPRESSION: 1. Patient's total coronary artery calcium score is 16 which is 83rd percentile for patient's of matched age, gender and race/ethnicity. Please note that although the presence of coronary artery calcium documents the presence of coronary artery disease, the severity of this disease and any potential stenosis cannot be assessed on this noncontrast CT examination. Assessment for potential risk factor modification, dietary therapy or pharmacologic therapy may be  warranted, if clinically indicated. 2. No significant incidental noncardiac findings are noted. Electronically Signed   By: Toribio Aye M.D.   On: 05/03/2023 10:47    Assessment & Plan:   Problem List Items Addressed This Visit     Anxiety   Increase Trintellix  to 20 mg/d Pt should be working from home due to medical reasons      Relevant Medications   vortioxetine  HBr (TRINTELLIX ) 20 MG TABS tablet   Coronary atherosclerosis   Current management with Georjean and Krill oil  Not interested in statins      Depression   Increase Trintellix  to 20 mg/d Pt should be working from home due to medical reasons      Relevant Medications   vortioxetine  HBr (TRINTELLIX ) 20 MG TABS tablet   Pre-diabetes - Primary   Off Wegovy Try Phentermine  if tolerated  Potential benefits of a long term phentermine  use as well as potential risks  and complications were explained to the patient and were aknowledged.       Relevant Orders   Comprehensive metabolic panel with GFR (Completed)   Hemoglobin A1c (Completed)      Meds ordered this encounter  Medications   vortioxetine  HBr (TRINTELLIX ) 20 MG TABS tablet    Sig: Take 1 tablet (20 mg total) by mouth daily.    Dispense:  90 tablet    Refill:  3   phentermine  (ADIPEX-P ) 37.5 MG tablet    Sig: Take 1 tablet (37.5 mg total) by mouth daily before breakfast.    Dispense:  30 tablet    Refill:  2      Follow-up: Return in about 3 months (around 11/23/2024) for a follow-up visit.  Marolyn Noel, MD

## 2024-08-24 NOTE — Assessment & Plan Note (Signed)
 Increase Trintellix  to 20 mg/d Pt should be working from home due to medical reasons

## 2024-08-25 ENCOUNTER — Other Ambulatory Visit: Payer: Self-pay | Admitting: Internal Medicine

## 2024-08-25 DIAGNOSIS — M542 Cervicalgia: Secondary | ICD-10-CM

## 2024-08-26 ENCOUNTER — Encounter: Payer: Self-pay | Admitting: Internal Medicine

## 2024-09-01 ENCOUNTER — Ambulatory Visit: Payer: Self-pay | Admitting: Internal Medicine

## 2024-09-01 DIAGNOSIS — G4733 Obstructive sleep apnea (adult) (pediatric): Secondary | ICD-10-CM | POA: Diagnosis not present

## 2024-09-04 ENCOUNTER — Other Ambulatory Visit: Payer: Self-pay | Admitting: Internal Medicine

## 2024-09-06 ENCOUNTER — Encounter: Payer: Self-pay | Admitting: Internal Medicine

## 2024-10-02 DIAGNOSIS — G549 Nerve root and plexus disorder, unspecified: Secondary | ICD-10-CM | POA: Insufficient documentation

## 2024-10-02 DIAGNOSIS — M5412 Radiculopathy, cervical region: Secondary | ICD-10-CM | POA: Diagnosis not present

## 2024-10-07 DIAGNOSIS — E890 Postprocedural hypothyroidism: Secondary | ICD-10-CM | POA: Diagnosis not present

## 2024-10-08 ENCOUNTER — Other Ambulatory Visit: Payer: Self-pay | Admitting: Internal Medicine

## 2024-10-20 DIAGNOSIS — M5412 Radiculopathy, cervical region: Secondary | ICD-10-CM | POA: Diagnosis not present

## 2024-10-21 DIAGNOSIS — E890 Postprocedural hypothyroidism: Secondary | ICD-10-CM | POA: Diagnosis not present

## 2024-10-21 DIAGNOSIS — R635 Abnormal weight gain: Secondary | ICD-10-CM | POA: Diagnosis not present

## 2024-10-21 DIAGNOSIS — L80 Vitiligo: Secondary | ICD-10-CM | POA: Diagnosis not present

## 2024-10-21 DIAGNOSIS — E669 Obesity, unspecified: Secondary | ICD-10-CM | POA: Diagnosis not present

## 2024-11-10 DIAGNOSIS — G4733 Obstructive sleep apnea (adult) (pediatric): Secondary | ICD-10-CM | POA: Diagnosis not present

## 2024-11-16 DIAGNOSIS — M5412 Radiculopathy, cervical region: Secondary | ICD-10-CM | POA: Diagnosis not present

## 2024-11-24 ENCOUNTER — Other Ambulatory Visit: Payer: Self-pay | Admitting: Internal Medicine

## 2024-11-24 DIAGNOSIS — M542 Cervicalgia: Secondary | ICD-10-CM

## 2024-11-25 ENCOUNTER — Encounter: Payer: Self-pay | Admitting: Internal Medicine

## 2024-11-30 ENCOUNTER — Encounter: Payer: Self-pay | Admitting: Internal Medicine

## 2024-11-30 ENCOUNTER — Ambulatory Visit: Admitting: Internal Medicine

## 2024-11-30 VITALS — BP 116/72 | HR 74 | Temp 98.4°F | Ht 64.5 in | Wt 200.0 lb

## 2024-11-30 DIAGNOSIS — F419 Anxiety disorder, unspecified: Secondary | ICD-10-CM

## 2024-11-30 DIAGNOSIS — R7303 Prediabetes: Secondary | ICD-10-CM

## 2024-11-30 DIAGNOSIS — J069 Acute upper respiratory infection, unspecified: Secondary | ICD-10-CM | POA: Diagnosis not present

## 2024-11-30 DIAGNOSIS — F3341 Major depressive disorder, recurrent, in partial remission: Secondary | ICD-10-CM | POA: Diagnosis not present

## 2024-11-30 DIAGNOSIS — E038 Other specified hypothyroidism: Secondary | ICD-10-CM

## 2024-11-30 LAB — COMPREHENSIVE METABOLIC PANEL WITH GFR
ALT: 15 U/L (ref 3–35)
AST: 17 U/L (ref 5–37)
Albumin: 4.3 g/dL (ref 3.5–5.2)
Alkaline Phosphatase: 59 U/L (ref 39–117)
BUN: 15 mg/dL (ref 6–23)
CO2: 24 meq/L (ref 19–32)
Calcium: 8.8 mg/dL (ref 8.4–10.5)
Chloride: 109 meq/L (ref 96–112)
Creatinine, Ser: 0.89 mg/dL (ref 0.40–1.20)
GFR: 70.51 mL/min
Glucose, Bld: 105 mg/dL — ABNORMAL HIGH (ref 70–99)
Potassium: 4.3 meq/L (ref 3.5–5.1)
Sodium: 140 meq/L (ref 135–145)
Total Bilirubin: 0.4 mg/dL (ref 0.2–1.2)
Total Protein: 6.5 g/dL (ref 6.0–8.3)

## 2024-11-30 LAB — TSH: TSH: 0.65 u[IU]/mL (ref 0.35–5.50)

## 2024-11-30 LAB — T4, FREE: Free T4: 0.88 ng/dL (ref 0.60–1.60)

## 2024-11-30 MED ORDER — TIRZEPATIDE-WEIGHT MANAGEMENT 2.5 MG/0.5ML ~~LOC~~ SOLN: 2.5000 mg | SUBCUTANEOUS | 5 refills | Status: AC

## 2024-11-30 MED ORDER — AZITHROMYCIN 250 MG PO TABS: ORAL_TABLET | ORAL | 0 refills | Status: AC

## 2024-11-30 NOTE — Patient Instructions (Signed)
 [employer] has recently reduced the self-pay cash prices for Zepbound  single-dose vials purchased through the [facility] online pharmacy   The new prices for a four-week supply (four single-dose vials) for self-paying patients are:   2.5 mg dose: $299 per month (previously $349) 5 mg dose: $399 per month (previously $499) 7.5 mg, 10 mg, 12.5 mg, and 15 mg doses: $449 per month (previously $499)

## 2024-11-30 NOTE — Progress Notes (Signed)
 "  Subjective:  Patient ID: April Elliott, female    DOB: 1965-12-22  Age: 58 y.o. MRN: [id number]  CC: Medical Management of Chronic Issues   HPI April Elliott presents for URI, COVID (-) C/o impairments for working and to have paperwork filled out.  F/u on pre-DM  Outpatient Medications Prior to Visit  Medication Sig Dispense Refill   Azelastine -Fluticasone  137-50 MCG/ACT SUSP PLACE 1 ACT INTO THE NOSE 1 DAY OR 1 DOSE. 23 g 5   Bacillus Coagulans-Inulin (PROBIOTIC) 1-250 BILLION-MG CAPS Take 1 capsule by mouth daily.     celecoxib  (CELEBREX ) 200 MG capsule TAKE 1 CAPSULE (200 MG TOTAL) BY MOUTH DAILY. PER MD RETURN IN ABOUT 6 MONTHS (AROUND 08/31/2023) FOR A FOLLOW-UP VISIT. 90 capsule 0   Cholecalciferol 50 MCG (2000 UT) CAPS 1 tablet Orally Once a day     clonazePAM  (KLONOPIN ) 0.5 MG tablet TAKE 1 TABLET BY MOUTH EVERY DAY FOR ANXIETY 30 tablet 2   COMBIPATCH 0.05-0.14 MG/DAY Place 1 patch onto the skin 2 (two) times a week.     Fluocinolone Acetonide Scalp 0.01 % OIL Apply to the scalp three times weekly     gabapentin (NEURONTIN) 300 MG capsule TAKE 1 CAPSULE IN THE MORNING, TAKE 1 AT NOON, THEN 2 AT BEDTIME 360 capsule 1   Krill Oil (OMEGA-3) 500 MG CAPS Take by mouth.     levothyroxine  (SYNTHROID ) 88 MCG tablet Take 1 tablet (88 mcg total) by mouth daily. 90 tablet 3   liothyronine  (CYTOMEL ) 5 MCG tablet Take 1 tablet (5 mcg total) by mouth daily. Per MD Return in about 6 months (around 08/31/2023) for a follow-up visit. 90 tablet 0   loratadine (CLARITIN) 10 MG tablet Take by mouth.     Magnesium 300 MG CAPS 1 capsule with a meal Orally Once a day     melatonin 3 MG TABS tablet Take 2 tablets (6 mg total) by mouth at bedtime.  0   methocarbamol (ROBAXIN) 500 MG tablet Take 500-1,000 mg by mouth every 6 (six) hours as needed.     microfibrillar collagen (AVITENE FLOUR) powder Apply topically.     minoxidil (LONITEN) 2.5 MG tablet Take 0.5 tablets by mouth daily.     Multiple  Vitamins-Minerals (MULTIVITAMIN GUMMIES ADULT) CHEW Chew 1 tablet by mouth daily.     Oyster Shell Calcium 500 MG TABS 1 tablet with meals Orally Twice a day     phentermine  (ADIPEX-P ) 37.5 MG tablet Take 1 tablet (37.5 mg total) by mouth daily before breakfast. 30 tablet 2   Plecanatide  (TRULANCE ) 3 MG TABS Take 1 tablet by mouth daily. 30 tablet    Ruxolitinib Phosphate (OPZELURA) 1.5 % CREA Apply topically.     vortioxetine  HBr (TRINTELLIX ) 20 MG TABS tablet Take 1 tablet (20 mg total) by mouth daily. 90 tablet 3   fluconazole (DIFLUCAN) 150 MG tablet Take 150 mg by mouth once. (Patient not taking: Reported on 11/30/2024)     Vilazodone  HCl 20 MG TABS TAKE 1 TABLET BY MOUTH EVERY DAY 90 tablet 1   No facility-administered medications prior to visit.    ROS: Review of Systems  Constitutional:  Negative for activity change, appetite change, chills, fatigue and unexpected weight change.  HENT:  Positive for congestion and sinus pressure. Negative for mouth sores.   Eyes:  Negative for visual disturbance.  Respiratory:  Positive for cough. Negative for chest tightness and shortness of breath.   Gastrointestinal:  Negative for abdominal pain and nausea.  Genitourinary:  Negative for difficulty urinating, frequency and vaginal pain.  Musculoskeletal:  Negative for back pain and gait problem.  Skin:  Negative for pallor and rash.  Neurological:  Negative for dizziness, tremors, weakness, numbness and headaches.  Hematological:  Does not bruise/bleed easily.  Psychiatric/Behavioral:  Positive for dysphoric mood. Negative for confusion, sleep disturbance and suicidal ideas. The patient is nervous/anxious.     Objective:  BP 116/72 (BP Location: Left Arm, Patient Position: Sitting, Cuff Size: Normal)   Pulse 74   Temp 98.4 F (36.9 C) (Oral)   Ht 5' 4.5 (1.638 m)   Wt 200 lb (90.7 kg)   LMP 05/18/2011   SpO2 94%   BMI 33.80 kg/m   BP Readings from Last 3 Encounters:  11/30/24 116/72   08/24/24 100/72  05/21/24 110/70    Wt Readings from Last 3 Encounters:  11/30/24 200 lb (90.7 kg)  08/24/24 199 lb 3.2 oz (90.4 kg)  05/21/24 197 lb (89.4 kg)    Physical Exam Constitutional:      General: She is not in acute distress.    Appearance: She is well-developed. She is obese.  HENT:     Head: Normocephalic.     Right Ear: External ear normal.     Left Ear: External ear normal.     Nose: Nose normal.  Eyes:     General:        Right eye: No discharge.        Left eye: No discharge.     Conjunctiva/sclera: Conjunctivae normal.     Pupils: Pupils are equal, round, and reactive to light.  Neck:     Thyroid : No thyromegaly.     Vascular: No JVD.     Trachea: No tracheal deviation.  Cardiovascular:     Rate and Rhythm: Normal rate and regular rhythm.     Heart sounds: Normal heart sounds.  Pulmonary:     Effort: No respiratory distress.     Breath sounds: No stridor. No wheezing.  Abdominal:     General: Bowel sounds are normal. There is no distension.     Palpations: Abdomen is soft. There is no mass.     Tenderness: There is no abdominal tenderness. There is no guarding or rebound.  Musculoskeletal:        General: No tenderness.     Cervical back: Normal range of motion and neck supple. No rigidity.     Right lower leg: No edema.     Left lower leg: No edema.  Lymphadenopathy:     Cervical: No cervical adenopathy.  Skin:    Findings: No erythema or rash.  Neurological:     Cranial Nerves: No cranial nerve deficit.     Motor: No abnormal muscle tone.     Coordination: Coordination normal.     Deep Tendon Reflexes: Reflexes normal.  Psychiatric:        Behavior: Behavior normal.        Thought Content: Thought content normal.        Judgment: Judgment normal.   Coughing   Lab Results  Component Value Date   WBC 5.2 02/20/2023   HGB 14.9 02/20/2023   HCT 45.0 02/20/2023   PLT 135 (L) 02/20/2023   GLUCOSE 101 (H) 08/24/2024   CHOL 155  02/20/2023   TRIG 72 02/20/2023   HDL 60 02/20/2023   LDLCALC 81 02/20/2023   ALT 10 08/24/2024   AST 14 08/24/2024   NA 139 08/24/2024  K 4.2 08/24/2024   CL 108 08/24/2024   CREATININE 0.91 08/24/2024   BUN 13 08/24/2024   CO2 25 08/24/2024   TSH 0.723 02/20/2023   HGBA1C 6.4 08/24/2024    CT CARDIAC SCORING ([facility] LOCATIONS ONLY) Result Date: 05/03/2023 CLINICAL DATA:  58 year old African American female with history of dyslipidemia and family history of coronary artery disease. * Tracking Code: FCC * EXAM: CT CARDIAC CORONARY ARTERY CALCIUM SCORE TECHNIQUE: Non-contrast imaging through the heart was performed using prospective ECG gating. Image post processing was performed on an independent workstation, allowing for quantitative analysis of the heart and coronary arteries. Note that this exam targets the heart and the chest was not imaged in its entirety. COMPARISON:  No priors. FINDINGS: CORONARY CALCIUM SCORES: Left Main: 0 LAD: 16 LCx: 0 RCA: 0 Total Agatston Score: 16 MESA database percentile: 83rd AORTA MEASUREMENTS: Ascending Aorta: 3.2 cm Descending Aorta:2.4 cm OTHER FINDINGS: Within the visualized portions of the thorax there are no suspicious appearing pulmonary nodules or masses, there is no acute consolidative airspace disease, no pleural effusions, no pneumothorax and no lymphadenopathy. Visualized portions of the upper abdomen are unremarkable. There are no aggressive appearing lytic or blastic lesions noted in the visualized portions of the skeleton. IMPRESSION: 1. Patient's total coronary artery calcium score is 16 which is 83rd percentile for patient's of matched age, gender and race/ethnicity. Please note that although the presence of coronary artery calcium documents the presence of coronary artery disease, the severity of this disease and any potential stenosis cannot be assessed on this noncontrast CT examination. Assessment for potential risk factor modification, dietary  therapy or pharmacologic therapy may be warranted, if clinically indicated. 2. No significant incidental noncardiac findings are noted. Electronically Signed   By: Toribio Aye M.D.   On: 05/03/2023 10:47    Assessment & Plan:   Problem List Items Addressed This Visit     Anxiety   On Trintellix  20 mg/d Pt should be working from home due to medical reasons      Depression - Primary   Increase Trintellix  to 20 mg/d Pt should be working from home due to medical reasons      Hypothyroidism   Dr Tommas On Synthroid  100 mcg now, Cytomel  Labs today      Relevant Orders   T4, free   TSH   Pre-diabetes   Off Wegovy Phentermine  d/c  Potential benefits of a long term phentermine  use as well as potential risks  and complications were explained to the patient and were aknowledged. Trial of Zepbound  in vials       Relevant Orders   T4, free   TSH   Hemoglobin A1c   Upper respiratory infection   Zpac if worse      Relevant Medications   azithromycin  (ZITHROMAX  Z-PAK) 250 MG tablet   Other Relevant Orders   Comprehensive metabolic panel with GFR      Meds ordered this encounter  Medications   azithromycin  (ZITHROMAX  Z-PAK) 250 MG tablet    Sig: As directed    Dispense:  6 tablet    Refill:  0   tirzepatide  (ZEPBOUND ) 2.5 MG/0.5ML injection vial    Sig: Inject 2.5 mg into the skin once a week.    Dispense:  2 mL    Refill:  5      Follow-up: Return in about 3 months (around 02/28/2025) for a follow-up visit.  Marolyn Noel, MD "

## 2024-11-30 NOTE — Assessment & Plan Note (Signed)
 On Trintellix  20 mg/d Pt should be working from home due to medical reasons

## 2024-11-30 NOTE — Assessment & Plan Note (Signed)
Zpac if worse 

## 2024-11-30 NOTE — Assessment & Plan Note (Addendum)
 Off Wegovy Phentermine  d/c  Potential benefits of a long term phentermine  use as well as potential risks  and complications were explained to the patient and were aknowledged. Trial of Zepbound  in vials

## 2024-11-30 NOTE — Assessment & Plan Note (Signed)
 Increase Trintellix  to 20 mg/d Pt should be working from home due to medical reasons

## 2024-11-30 NOTE — Assessment & Plan Note (Addendum)
 Dr Balan On Synthroid  100 mcg now, Cytomel  Labs today

## 2024-12-01 LAB — HEMOGLOBIN A1C: Hgb A1c MFr Bld: 6 % (ref 4.6–6.5)

## 2024-12-02 ENCOUNTER — Encounter: Payer: Self-pay | Admitting: Internal Medicine

## 2024-12-04 ENCOUNTER — Ambulatory Visit: Payer: Self-pay | Admitting: Internal Medicine

## 2024-12-08 ENCOUNTER — Encounter: Payer: Self-pay | Admitting: Internal Medicine

## 2024-12-08 ENCOUNTER — Other Ambulatory Visit: Payer: Self-pay

## 2024-12-08 MED ORDER — FLUCONAZOLE 150 MG PO TABS: 150.0000 mg | ORAL_TABLET | Freq: Once | ORAL | 0 refills | Status: AC

## 2024-12-09 ENCOUNTER — Other Ambulatory Visit: Payer: Self-pay

## 2024-12-09 MED ORDER — TIRZEPATIDE-WEIGHT MANAGEMENT 2.5 MG/0.5ML ~~LOC~~ SOLN: 2.5000 mg | SUBCUTANEOUS | 5 refills | Status: AC

## 2025-03-01 ENCOUNTER — Ambulatory Visit: Admitting: Internal Medicine
